# Patient Record
Sex: Female | Born: 1990 | Race: White | Hispanic: No | Marital: Single | Smoking: Current every day smoker
Health system: Southern US, Community
[De-identification: ages and names within clinical notes are randomized; demographics above are authoritative.]

## PROBLEM LIST (undated history)

## (undated) DIAGNOSIS — F329 Major depressive disorder, single episode, unspecified: Secondary | ICD-10-CM

## (undated) DIAGNOSIS — F32A Depression, unspecified: Secondary | ICD-10-CM

## (undated) DIAGNOSIS — O139 Gestational [pregnancy-induced] hypertension without significant proteinuria, unspecified trimester: Secondary | ICD-10-CM

## (undated) DIAGNOSIS — R87629 Unspecified abnormal cytological findings in specimens from vagina: Secondary | ICD-10-CM

## (undated) HISTORY — DX: Unspecified abnormal cytological findings in specimens from vagina: R87.629

## (undated) HISTORY — PX: WISDOM TOOTH EXTRACTION: SHX21

## (undated) HISTORY — PX: LEEP: SHX91

---
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
## 2010-12-23 LAB — COMPREHENSIVE METABOLIC PANEL
ALT: 19 U/L (ref 0–35)
AST: 19 U/L (ref 0–37)
Albumin: 3.7 g/dL (ref 3.5–5.2)
Alkaline Phosphatase: 53 U/L (ref 47–119)
BUN: 10 mg/dL (ref 6–23)
CO2: 24 mEq/L (ref 19–32)
Calcium: 9.2 mg/dL (ref 8.4–10.5)
Chloride: 110 mEq/L (ref 96–112)
Creatinine, Ser: 0.82 mg/dL (ref 0.4–1.2)
Glucose, Bld: 74 mg/dL (ref 70–99)
Potassium: 4.8 mEq/L (ref 3.5–5.1)
Sodium: 137 mEq/L (ref 135–145)
Total Bilirubin: 0.3 mg/dL (ref 0.3–1.2)
Total Protein: 6.9 g/dL (ref 6.0–8.3)

## 2010-12-23 LAB — CBC
HCT: 38.7 % (ref 36.0–49.0)
Hemoglobin: 13.4 g/dL (ref 12.0–16.0)
MCHC: 34.5 g/dL (ref 31.0–37.0)
MCV: 89.8 fL (ref 78.0–98.0)
Platelets: 198 10*3/uL (ref 150–400)
RBC: 4.31 MIL/uL (ref 3.80–5.70)
RDW: 12.3 % (ref 11.4–15.5)
WBC: 8.8 10*3/uL (ref 4.5–13.5)

## 2010-12-23 LAB — URINALYSIS, ROUTINE W REFLEX MICROSCOPIC
Bilirubin Urine: NEGATIVE
Hgb urine dipstick: NEGATIVE
Ketones, ur: NEGATIVE mg/dL
Nitrite: NEGATIVE
Protein, ur: NEGATIVE mg/dL
Urobilinogen, UA: 0.2 mg/dL (ref 0.0–1.0)

## 2010-12-23 LAB — DIFFERENTIAL
Basophils Absolute: 0.1 10*3/uL (ref 0.0–0.1)
Basophils Relative: 1 % (ref 0–1)
Eosinophils Absolute: 0.2 10*3/uL (ref 0.0–1.2)
Eosinophils Relative: 2 % (ref 0–5)
Lymphocytes Relative: 30 % (ref 24–48)

## 2010-12-23 LAB — D-DIMER, QUANTITATIVE: D-Dimer, Quant: 0.22 ug/mL-FEU (ref 0.00–0.48)

## 2011-05-05 ENCOUNTER — Emergency Department (HOSPITAL_COMMUNITY): Payer: Self-pay

## 2011-05-05 ENCOUNTER — Encounter (HOSPITAL_COMMUNITY): Payer: Self-pay

## 2011-05-05 ENCOUNTER — Emergency Department (HOSPITAL_COMMUNITY)
Admission: EM | Admit: 2011-05-05 | Discharge: 2011-05-05 | Disposition: A | Discharge status: Home / Self Care | Payer: Self-pay | Attending: Emergency Medicine | Admitting: Emergency Medicine

## 2011-05-05 DIAGNOSIS — R10819 Abdominal tenderness, unspecified site: Secondary | ICD-10-CM | POA: Insufficient documentation

## 2011-05-05 DIAGNOSIS — M549 Dorsalgia, unspecified: Secondary | ICD-10-CM | POA: Insufficient documentation

## 2011-05-05 DIAGNOSIS — F17200 Nicotine dependence, unspecified, uncomplicated: Secondary | ICD-10-CM | POA: Insufficient documentation

## 2011-05-05 DIAGNOSIS — Z8541 Personal history of malignant neoplasm of cervix uteri: Secondary | ICD-10-CM | POA: Insufficient documentation

## 2011-05-05 DIAGNOSIS — K5900 Constipation, unspecified: Secondary | ICD-10-CM | POA: Insufficient documentation

## 2011-05-05 DIAGNOSIS — R109 Unspecified abdominal pain: Secondary | ICD-10-CM | POA: Insufficient documentation

## 2011-05-05 LAB — CBC
MCH: 31.1 pg (ref 26.0–34.0)
Platelets: 158 10*3/uL (ref 150–400)
RBC: 4.44 MIL/uL (ref 3.87–5.11)
RDW: 13.4 % (ref 11.5–15.5)
WBC: 6.1 10*3/uL (ref 4.0–10.5)

## 2011-05-05 LAB — URINALYSIS, ROUTINE W REFLEX MICROSCOPIC
Bilirubin Urine: NEGATIVE
Glucose, UA: NEGATIVE mg/dL
Hgb urine dipstick: NEGATIVE
Ketones, ur: NEGATIVE mg/dL
Protein, ur: NEGATIVE mg/dL

## 2011-05-05 LAB — COMPREHENSIVE METABOLIC PANEL
Alkaline Phosphatase: 54 U/L (ref 39–117)
BUN: 8 mg/dL (ref 6–23)
CO2: 26 mEq/L (ref 19–32)
Chloride: 103 mEq/L (ref 96–112)
Creatinine, Ser: 0.65 mg/dL (ref 0.50–1.10)
GFR calc non Af Amer: >90 mL/min (ref >90)
Potassium: 3.9 mEq/L (ref 3.5–5.1)
Total Bilirubin: 0.3 mg/dL (ref 0.3–1.2)

## 2011-05-05 MED ORDER — ONDANSETRON HCL 4 MG/2ML IJ SOLN
4.0000 mg | Freq: Once | INTRAMUSCULAR | Status: AC
  Administered 2011-05-05: 4 mg via INTRAVENOUS
  Filled 2011-05-05: qty 2

## 2011-05-05 MED ORDER — HYDROCODONE-ACETAMINOPHEN 5-325 MG PO TABS: 1.0000 | ORAL_TABLET | Freq: Four times a day (QID) | ORAL | Status: AC | PRN

## 2011-05-05 MED ORDER — SODIUM CHLORIDE 0.9 % IV BOLUS (SEPSIS)
250.0000 mL | Freq: Once | INTRAVENOUS | Status: AC
  Administered 2011-05-05: 250 mL via INTRAVENOUS

## 2011-05-05 MED ORDER — IOHEXOL 300 MG/ML  SOLN
100.0000 mL | Freq: Once | INTRAMUSCULAR | Status: AC | PRN
  Administered 2011-05-05: 100 mL via INTRAVENOUS

## 2011-05-05 MED ORDER — IOHEXOL 300 MG/ML  SOLN: 40.0000 mL | Freq: Once | INTRAMUSCULAR | Status: DC | PRN

## 2011-05-05 MED ORDER — IOHEXOL 300 MG/ML  SOLN
40.0000 mL | Freq: Once | INTRAMUSCULAR | Status: AC | PRN
  Administered 2011-05-05: 40 mL via ORAL

## 2011-05-05 MED ORDER — HYDROMORPHONE HCL PF 1 MG/ML IJ SOLN
1.0000 mg | Freq: Once | INTRAMUSCULAR | Status: AC
  Administered 2011-05-05: 1 mg via INTRAVENOUS
  Filled 2011-05-05: qty 1

## 2011-05-05 MED ORDER — SODIUM CHLORIDE 0.9 % IV SOLN: INTRAVENOUS | Status: DC

## 2011-05-05 NOTE — ED Notes (Signed)
Pt presents with bilateral flank pain and was diagnosed with UTI on Wednesday. Pt also reports "cervical" pain. Pt states she was diagnosed with cervical CA.

## 2011-05-05 NOTE — ED Provider Notes (Signed)
History   This chart was scribed for Lori Jakes, MD by Melba Coon. The patient was seen in room APA03/APA03 and the patient's care was started at 3:15PM.    CSN: [id number]  Arrival date & time 05/05/11  1441   First MD Initiated Contact with Patient 05/05/11 1510      Chief Complaint  Patient presents with  . Urinary Tract Infection  . Back Pain    (Consider location/radiation/quality/duration/timing/severity/associated sxs/prior treatment) HPI Lori Barnes is a 21 y.o. female who presents to the Emergency Department complaining of constant, moderate to severe bilateral back pain with associated stabbing cervical pain with an onset a week ago. Pt has been seen by a Dr for Lori Barnes symptoms before and was diagnosed as a UTI 2 days ago. She was given abx (taken every 12 hrs) which have not alleviated the symptoms and the pain has been gradually getting worse since onset. Her previous Dr told her to present to the ED. Pt has a Hx of cervical CA that was removed 2 months ago. Pt states that she does not have dysuria but it "feels weird to pee" and that she feels like she has to force herself to urinate. Fever present in the morning; pt states that the fever breaks around lunchtime, then comes back at night. Constipation present. Migraine HA present but doesn't think its related to CC. No neck pain, n/v/d, rash, or edema. LNMP: 5th of January; has not had one this month.  GYN: Dr. Donzetta Barnes in [place]  Yellow and black pill starts with N??   Past Medical History  Diagnosis Date  . Cancer     cervical/ had leep procedure    History reviewed. No pertinent past surgical history.  No family history on file.  History  Substance Use Topics  . Smoking status: Current Everyday Smoker -- 0.5 packs/day  . Smokeless tobacco: Not on file  . Alcohol Use: Yes     occasional     OB History    Grav Para Term Preterm Abortions TAB SAB Ect Mult Living                  Review of  Systems 10 Systems reviewed and are negative for acute change except as noted in the HPI.  Allergies  Review of patient's allergies indicates no known allergies.  Home Medications   Current Outpatient Rx  Name Route Sig Dispense Refill  . ACETAMINOPHEN 500 MG PO TABS Oral Take 1,000 mg by mouth 3 (three) times daily as needed. For pain    . NITROFURANTOIN MACROCRYSTAL 100 MG PO CAPS Oral Take 100 mg by mouth every 12 (twelve) hours.    Marland Kitchen HYDROCODONE-ACETAMINOPHEN 5-325 MG PO TABS Oral Take 1-2 tablets by mouth every 6 (six) hours as needed for pain. 10 tablet 0    BP 108/56  Pulse 89  Temp(Src) 98 F (36.7 C) (Oral)  Resp 18  Ht 5' (1.524 m)  Wt 170 lb (77.111 kg)  BMI 33.20 kg/m2  SpO2 99%  LMP 03/25/2011  Physical Exam  Nursing note and vitals reviewed. Constitutional: She appears well-developed and well-nourished.       Awake, alert, nontoxic appearance.  HENT:  Head: Normocephalic and atraumatic.  Mouth/Throat: Oropharynx is clear and moist.  Eyes: Conjunctivae and EOM are normal. Pupils are equal, round, and reactive to light. Right eye exhibits no discharge. Left eye exhibits no discharge.  Neck: Normal range of motion. Neck supple.  Cardiovascular: Normal rate, regular  rhythm and normal heart sounds.   No murmur heard. Pulmonary/Chest: Effort normal and breath sounds normal. She has no wheezes. She exhibits no tenderness.  Abdominal: Soft. Bowel sounds are normal. There is tenderness (Mild suprapubic tenderness). There is no rebound.  Musculoskeletal: She exhibits no tenderness (No CVA tenderness).       Baseline ROM, no obvious new focal weakness.  Neurological:       Mental status and motor strength appears baseline for patient and situation.  Skin: Skin is warm. No rash noted.  Psychiatric: She has a normal mood and affect.    ED Course  Procedures (including critical care time)  DIAGNOSTIC STUDIES: Oxygen Saturation is 97% on room air, normal by my  interpretation.    COORDINATION OF CARE:  Results for orders placed during the hospital encounter of 05/05/11  URINALYSIS, ROUTINE W REFLEX MICROSCOPIC      Component Value Range   Color, Urine YELLOW  YELLOW    APPearance CLEAR  CLEAR    Specific Gravity, Urine 1.010  1.005 - 1.030    pH 6.5  5.0 - 8.0    Glucose, UA NEGATIVE  NEGATIVE (mg/dL)   Hgb urine dipstick NEGATIVE  NEGATIVE    Bilirubin Urine NEGATIVE  NEGATIVE    Ketones, ur NEGATIVE  NEGATIVE (mg/dL)   Protein, ur NEGATIVE  NEGATIVE (mg/dL)   Urobilinogen, UA 0.2  0.0 - 1.0 (mg/dL)   Nitrite NEGATIVE  NEGATIVE    Leukocytes, UA NEGATIVE  NEGATIVE   PREGNANCY, URINE      Component Value Range   Preg Test, Ur NEGATIVE  NEGATIVE   COMPREHENSIVE METABOLIC PANEL      Component Value Range   Sodium 136  135 - 145 (mEq/L)   Potassium 3.9  3.5 - 5.1 (mEq/L)   Chloride 103  96 - 112 (mEq/L)   CO2 26  19 - 32 (mEq/L)   Glucose, Bld 88  70 - 99 (mg/dL)   BUN 8  6 - 23 (mg/dL)   Creatinine, Ser 1.61  0.50 - 1.10 (mg/dL)   Calcium 9.4  8.4 - 09.6 (mg/dL)   Total Protein 6.8  6.0 - 8.3 (g/dL)   Albumin 3.8  3.5 - 5.2 (g/dL)   AST 16  0 - 37 (U/L)   ALT 13  0 - 35 (U/L)   Alkaline Phosphatase 54  39 - 117 (U/L)   Total Bilirubin 0.3  0.3 - 1.2 (mg/dL)   GFR calc non Af Amer >90  >90 (mL/min)   GFR calc Af Amer >90  >90 (mL/min)  CBC      Component Value Range   WBC 6.1  4.0 - 10.5 (K/uL)   RBC 4.44  3.87 - 5.11 (MIL/uL)   Hemoglobin 13.8  12.0 - 15.0 (g/dL)   HCT 04.5  40.9 - 81.1 (%)   MCV 94.4  78.0 - 100.0 (fL)   MCH 31.1  26.0 - 34.0 (pg)   MCHC 32.9  30.0 - 36.0 (g/dL)   RDW 91.4  78.2 - 95.6 (%)   Platelets 158  150 - 400 (K/uL)    Ct Abdomen Pelvis W Contrast  05/05/2011  *RADIOLOGY REPORT*  Clinical Data: Bilateral flank pain.  Being treated for urinary tract infection for 2 days.  Suprapubic pain with nausea vomiting. History of cervical cancer.  CT ABDOMEN AND PELVIS WITH CONTRAST  Technique:   Multidetector CT imaging of the abdomen and pelvis was performed following the standard protocol during bolus administration  of intravenous contrast.  Contrast: OMNIPAQUE IOHEXOL 300 MG/ML IV SOLN  Comparison: None.  Findings: Minimal ground-glass opacity at the left lung base with mild motion degradation in this area.  Heart size upper normal, without pericardial or pleural effusion.  Tiny hiatal hernia.  Normal liver, spleen, stomach, pancreas, gallbladder, biliary tract, adrenal glands, kidneys. No hydroureter or ureteric calculi. Small retroperitoneal nodes, without adenopathy.  Normal colon, appendix, and terminal ileum.  Normal small bowel without abdominal ascites.  No pelvic adenopathy.    Normal urinary bladder and uterus.  No adnexal mass.  No significant free fluid.  Disc bulges at L4-L5 and L5-S1 incidentally noted.  IMPRESSION:  1. No acute process in the abdomen or pelvis. 2.  Probable atelectasis at the left lung base.  Original Report Authenticated By: Consuello Bossier, M.D.     1. Abdominal pain       MDM  Workup today negative for any significant pathology. Brain CT chest negative urinalysis negative for urinary tract infection CT scan without specific findings to explain bilateral upper quadrant and lower quadrant abdominal pain. Recommend followup with GYN Dr. In Jonita Albee for further evaluation.  I personally performed the services described in this documentation, which was scribed in my presence. The recorded information has been reviewed and considered.         Lori Jakes, MD 05/05/11 [phone]

## 2012-12-13 ENCOUNTER — Emergency Department (HOSPITAL_COMMUNITY)
Admission: EM | Admit: 2012-12-13 | Discharge: 2012-12-13 | Disposition: A | Discharge status: Home / Self Care | Payer: Self-pay | Attending: Emergency Medicine | Admitting: Emergency Medicine

## 2012-12-13 ENCOUNTER — Encounter (HOSPITAL_COMMUNITY): Payer: Self-pay | Admitting: *Deleted

## 2012-12-13 DIAGNOSIS — N946 Dysmenorrhea, unspecified: Secondary | ICD-10-CM

## 2012-12-13 DIAGNOSIS — R102 Pelvic and perineal pain: Secondary | ICD-10-CM

## 2012-12-13 DIAGNOSIS — F17200 Nicotine dependence, unspecified, uncomplicated: Secondary | ICD-10-CM | POA: Insufficient documentation

## 2012-12-13 DIAGNOSIS — Z3202 Encounter for pregnancy test, result negative: Secondary | ICD-10-CM | POA: Insufficient documentation

## 2012-12-13 DIAGNOSIS — Z8541 Personal history of malignant neoplasm of cervix uteri: Secondary | ICD-10-CM | POA: Insufficient documentation

## 2012-12-13 DIAGNOSIS — N949 Unspecified condition associated with female genital organs and menstrual cycle: Secondary | ICD-10-CM | POA: Insufficient documentation

## 2012-12-13 LAB — WET PREP, GENITAL: Trich, Wet Prep: NONE SEEN

## 2012-12-13 LAB — URINALYSIS, ROUTINE W REFLEX MICROSCOPIC
Bilirubin Urine: NEGATIVE
Ketones, ur: NEGATIVE mg/dL
Leukocytes, UA: NEGATIVE
Nitrite: NEGATIVE
Urobilinogen, UA: 0.2 mg/dL (ref 0.0–1.0)
pH: 6.5 (ref 5.0–8.0)

## 2012-12-13 LAB — URINE MICROSCOPIC-ADD ON

## 2012-12-13 MED ORDER — IBUPROFEN 800 MG PO TABS: 800.0000 mg | ORAL_TABLET | Freq: Three times a day (TID) | ORAL | Status: DC

## 2012-12-13 NOTE — ED Provider Notes (Signed)
CSN: [id number]     Arrival date & time 12/13/12  2059 History  This chart was scribed for Glynn Octave, MD by Bennett Scrape, ED Scribe. This patient was seen in room APA06/APA06 and the patient's care was started at 9:22 PM.   Chief Complaint  Patient presents with  . Back Pain  . Abdominal Pain    The history is provided by the patient. No language interpreter was used.   HPI Comments: Lori Barnes is a 22 y.o. female who presents to the Emergency Department complaining of sharp cramps in the lower back that radiates up to the mid back with associated mild lower abdominal pain and vaginal bleeding that started yesterday morning. Her LNMP was August 22nd, 2104 so when the symptoms originally started she attributed them to her normal menses. She denies that the symptoms are similar to her prior menstrual cramps but states that she thought that she was going to have a heavier menses than normal. She became concerned when she passed "something like a dark mucus clot" that size of a quarter from her vagina. Since then, she states that she only has vaginal bleeding during episodes of crying or stress. She reports taking Advil with no improvement in the pain. She states that laying still improves the symptoms and she denies having trouble sleeping due to the pain. She called [facility] and was advised to come to the ED for evaluation. Pt denies having prior episodes of similar symptoms. She denies any known fevers, hematuria, dysuria and emesis as associated symptoms. She denies any changes in her bowel movements and reports that she has been eating and drinking normally since the onset.  She denies being on any birth control currently. She denies any prior abdominal surgeries.   Past Medical History  Diagnosis Date  . Cancer     cervical/ had leep procedure   History reviewed. No pertinent past surgical history. History reviewed. No pertinent family history. History  Substance  Use Topics  . Smoking status: Current Every Day Smoker -- 0.50 packs/day  . Smokeless tobacco: Not on file  . Alcohol Use: Yes     Comment: occasional    No OB history provided.  Review of Systems  A complete 10 system review of systems was obtained and all systems are negative except as noted in the HPI and PMH.   Allergies  Review of patient's allergies indicates no known allergies.  Home Medications   Current Outpatient Rx  Name  Route  Sig  Dispense  Refill  . ibuprofen (ADVIL,MOTRIN) 200 MG tablet   Oral   Take 400-600 mg by mouth every 6 (six) hours as needed for pain.         Marland Kitchen UNKNOWN TO PATIENT   Oral   Take 1 tablet by mouth daily. BIRTH CONTROL         . ibuprofen (ADVIL,MOTRIN) 800 MG tablet   Oral   Take 1 tablet (800 mg total) by mouth 3 (three) times daily.   21 tablet   0    Triage Vitals: BP 120/59  Pulse 106  Temp(Src) 97.7 F (36.5 C)  Resp 20  Ht 5\' 1"  (1.549 m)  Wt 168 lb (76.204 kg)  BMI 31.76 kg/m2  SpO2 100%  LMP 11/08/2012  Physical Exam  Nursing note and vitals reviewed. Constitutional: She is oriented to person, place, and time. She appears well-developed and well-nourished. No distress.  HENT:  Head: Normocephalic and atraumatic.  Eyes: Conjunctivae  and EOM are normal.  Neck: Normal range of motion. Neck supple. No tracheal deviation present.  Cardiovascular: Normal rate, regular rhythm and normal heart sounds.   No murmur heard. Pulmonary/Chest: Effort normal and breath sounds normal. No respiratory distress. She has no wheezes. She has no rales.  Abdominal: Soft. Bowel sounds are normal. There is tenderness (suprapubic ). There is no rebound, no guarding and no tenderness at McBurney's point.  No CVA tenderness  Genitourinary:  Normal external female genitalia, dark blood in the vaginal vault with oozing from cervix, no CMT, no adnexal tenderness, chaperone present  Musculoskeletal: Normal range of motion. She exhibits no  edema.  Bilateral paraspinal lumbar tenderness  Neurological: She is alert and oriented to person, place, and time. No cranial nerve deficit.  5/5 strength in bilateral lower extremities. Ankle plantar and dorsiflexion intact. Great toe extension intact bilaterally. +2 DP and PT pulses. +2 patellar reflexes bilaterally.   Skin: Skin is warm and dry.  Psychiatric: She has a normal mood and affect. Her behavior is normal.    ED Course  Procedures (including critical care time)  DIAGNOSTIC STUDIES: Oxygen Saturation is 100% on room air, normal by my interpretation.    COORDINATION OF CARE: 9:24 PM-Discussed treatment plan which includes pelvic exam and UA with pt at bedside and pt agreed to plan.   Labs Review Labs Reviewed  URINALYSIS, ROUTINE W REFLEX MICROSCOPIC - Abnormal; Notable for the following:    Hgb urine dipstick MODERATE (*)    All other components within normal limits  WET PREP, GENITAL  GC/CHLAMYDIA PROBE AMP  PREGNANCY, URINE  URINE MICROSCOPIC-ADD ON   Imaging Review No results found.  MDM   1. Menses painful   2. Pelvic pain    Lower abdominal pain with back pain that onset yesterday morning associated with intermittent vaginal bleeding.  Abdominal exam is benign. Pelvic exam shows dark blood in the vaginal vault without adnexal tenderness.  UA is negative for infection. HCG negative. Suspect patient's discomfort and vaginal bleeding secondary to normal menses. We'll treat with anti-inflammatories.  I personally performed the services described in this documentation, which was scribed in my presence. The recorded information has been reviewed and is accurate.  BP 120/59  Pulse 106  Temp(Src) 97.7 F (36.5 C)  Resp 20  Ht 5\' 1"  (1.549 m)  Wt 168 lb (76.204 kg)  BMI 31.76 kg/m2  SpO2 100%  LMP 11/08/2012   Glynn Octave, MD 12/13/12 2308

## 2012-12-13 NOTE — ED Notes (Signed)
Pt states 36 hrs ago, she began having sharp pains in her mid left back. Pt also states she was having some cramps in her luq. Pt states she began having what she thought was her period an then passed a quarter sized dark mucous "clot" and then the bleeding stopped.

## 2012-12-15 LAB — GC/CHLAMYDIA PROBE AMP
CT Probe RNA: NEGATIVE
GC Probe RNA: NEGATIVE

## 2013-12-15 ENCOUNTER — Encounter (HOSPITAL_COMMUNITY): Payer: Self-pay | Admitting: Emergency Medicine

## 2013-12-15 ENCOUNTER — Emergency Department (HOSPITAL_COMMUNITY)
Admission: EM | Admit: 2013-12-15 | Discharge: 2013-12-15 | Discharge status: Against Medical Advice | Payer: Self-pay | Attending: Emergency Medicine | Admitting: Emergency Medicine

## 2013-12-15 DIAGNOSIS — Y939 Activity, unspecified: Secondary | ICD-10-CM | POA: Insufficient documentation

## 2013-12-15 DIAGNOSIS — R1110 Vomiting, unspecified: Secondary | ICD-10-CM | POA: Insufficient documentation

## 2013-12-15 DIAGNOSIS — Y929 Unspecified place or not applicable: Secondary | ICD-10-CM | POA: Insufficient documentation

## 2013-12-15 DIAGNOSIS — F17200 Nicotine dependence, unspecified, uncomplicated: Secondary | ICD-10-CM | POA: Insufficient documentation

## 2013-12-15 DIAGNOSIS — IMO0002 Reserved for concepts with insufficient information to code with codable children: Secondary | ICD-10-CM | POA: Insufficient documentation

## 2013-12-15 DIAGNOSIS — R296 Repeated falls: Secondary | ICD-10-CM | POA: Insufficient documentation

## 2013-12-15 DIAGNOSIS — R42 Dizziness and giddiness: Secondary | ICD-10-CM | POA: Insufficient documentation

## 2013-12-15 NOTE — ED Notes (Signed)
Pt c/o lower back pain after falling this am; pt states she began vomiting x 3 hours ago and is c/o feeling dizzy

## 2013-12-15 NOTE — ED Notes (Signed)
Called x 1 to go to room after triage with no answer.

## 2013-12-15 NOTE — ED Notes (Signed)
Unable to locate pt x 3 in all waiting areas.

## 2013-12-15 NOTE — ED Notes (Signed)
Called pt in all waiting areas with no answer

## 2013-12-16 ENCOUNTER — Emergency Department (HOSPITAL_COMMUNITY): Payer: Self-pay

## 2013-12-16 ENCOUNTER — Emergency Department (HOSPITAL_COMMUNITY)
Admission: EM | Admit: 2013-12-16 | Discharge: 2013-12-16 | Disposition: A | Discharge status: Home / Self Care | Payer: Self-pay | Attending: Emergency Medicine | Admitting: Emergency Medicine

## 2013-12-16 ENCOUNTER — Encounter (HOSPITAL_COMMUNITY): Payer: Self-pay | Admitting: Emergency Medicine

## 2013-12-16 DIAGNOSIS — R112 Nausea with vomiting, unspecified: Secondary | ICD-10-CM

## 2013-12-16 DIAGNOSIS — Z791 Long term (current) use of non-steroidal anti-inflammatories (NSAID): Secondary | ICD-10-CM | POA: Insufficient documentation

## 2013-12-16 DIAGNOSIS — F17200 Nicotine dependence, unspecified, uncomplicated: Secondary | ICD-10-CM | POA: Insufficient documentation

## 2013-12-16 DIAGNOSIS — R109 Unspecified abdominal pain: Secondary | ICD-10-CM | POA: Insufficient documentation

## 2013-12-16 DIAGNOSIS — Z3202 Encounter for pregnancy test, result negative: Secondary | ICD-10-CM | POA: Insufficient documentation

## 2013-12-16 DIAGNOSIS — R10A1 Flank pain, right side: Secondary | ICD-10-CM

## 2013-12-16 LAB — URINALYSIS, ROUTINE W REFLEX MICROSCOPIC
Glucose, UA: NEGATIVE mg/dL
Nitrite: NEGATIVE
Specific Gravity, Urine: 1.02 (ref 1.005–1.030)
Urobilinogen, UA: 1 mg/dL (ref 0.0–1.0)
pH: 7 (ref 5.0–8.0)

## 2013-12-16 LAB — URINE MICROSCOPIC-ADD ON

## 2013-12-16 LAB — BASIC METABOLIC PANEL
Anion gap: 10 (ref 5–15)
BUN: 11 mg/dL (ref 6–23)
CO2: 25 mEq/L (ref 19–32)
Calcium: 8.9 mg/dL (ref 8.4–10.5)
Chloride: 103 mEq/L (ref 96–112)
Creatinine, Ser: 0.79 mg/dL (ref 0.50–1.10)
GFR calc Af Amer: >90 mL/min (ref >90)
GLUCOSE: 91 mg/dL (ref 70–99)
Potassium: 3.7 mEq/L (ref 3.7–5.3)
Sodium: 138 mEq/L (ref 137–147)

## 2013-12-16 LAB — CBC WITH DIFFERENTIAL/PLATELET
Basophils Absolute: 0 10*3/uL (ref 0.0–0.1)
Basophils Relative: 0 % (ref 0–1)
EOS PCT: 2 % (ref 0–5)
Eosinophils Absolute: 0.2 10*3/uL (ref 0.0–0.7)
HCT: 39.4 % (ref 36.0–46.0)
Hemoglobin: 13.2 g/dL (ref 12.0–15.0)
LYMPHS ABS: 2.3 10*3/uL (ref 0.7–4.0)
Lymphocytes Relative: 33 % (ref 12–46)
MCH: 30.6 pg (ref 26.0–34.0)
MCHC: 33.5 g/dL (ref 30.0–36.0)
MCV: 91.4 fL (ref 78.0–100.0)
Monocytes Absolute: 0.5 10*3/uL (ref 0.1–1.0)
Monocytes Relative: 7 % (ref 3–12)
Neutro Abs: 4.1 10*3/uL (ref 1.7–7.7)
Neutrophils Relative %: 58 % (ref 43–77)
PLATELETS: 213 10*3/uL (ref 150–400)
RBC: 4.31 MIL/uL (ref 3.87–5.11)
RDW: 12.7 % (ref 11.5–15.5)
WBC: 7.1 10*3/uL (ref 4.0–10.5)

## 2013-12-16 LAB — HEPATIC FUNCTION PANEL
ALT: 18 U/L (ref 0–35)
AST: 21 U/L (ref 0–37)
Albumin: 3.8 g/dL (ref 3.5–5.2)
Alkaline Phosphatase: 64 U/L (ref 39–117)
Total Bilirubin: 0.2 mg/dL — ABNORMAL LOW (ref 0.3–1.2)
Total Protein: 7.2 g/dL (ref 6.0–8.3)

## 2013-12-16 LAB — PREGNANCY, URINE: Preg Test, Ur: NEGATIVE

## 2013-12-16 LAB — LIPASE, BLOOD: Lipase: 20 U/L (ref 11–59)

## 2013-12-16 MED ORDER — HYDROCODONE-ACETAMINOPHEN 5-325 MG PO TABS: ORAL_TABLET | ORAL | Status: DC

## 2013-12-16 MED ORDER — KETOROLAC TROMETHAMINE 60 MG/2ML IM SOLN
60.0000 mg | Freq: Once | INTRAMUSCULAR | Status: AC
  Administered 2013-12-16: 60 mg via INTRAMUSCULAR
  Filled 2013-12-16: qty 2

## 2013-12-16 MED ORDER — OXYCODONE-ACETAMINOPHEN 5-325 MG PO TABS
1.0000 | ORAL_TABLET | Freq: Once | ORAL | Status: AC
  Administered 2013-12-16: 1 via ORAL
  Filled 2013-12-16: qty 1

## 2013-12-16 MED ORDER — ONDANSETRON HCL 4 MG PO TABS: 4.0000 mg | ORAL_TABLET | Freq: Three times a day (TID) | ORAL | Status: DC | PRN

## 2013-12-16 MED ORDER — NAPROXEN 250 MG PO TABS: 250.0000 mg | ORAL_TABLET | Freq: Two times a day (BID) | ORAL | Status: DC | PRN

## 2013-12-16 MED ORDER — ONDANSETRON 8 MG PO TBDP
8.0000 mg | ORAL_TABLET | Freq: Once | ORAL | Status: AC
  Administered 2013-12-16: 8 mg via ORAL
  Filled 2013-12-16: qty 1

## 2013-12-16 NOTE — ED Provider Notes (Signed)
CSN: [id number]     Arrival date & time 12/16/13  1510 History   First MD Initiated Contact with Patient 12/16/13 1624     Chief Complaint  Patient presents with  . Abdominal Pain      HPI Pt was seen at 1740.  Per pt, c/o gradual onset and persistence of constant right sided upper abd/flank "pain" since yesterday.  Has been associated with multiple intermittent episodes of N/V.  Describes the abd pain as "aching."  States her symptoms began after she slipped and fell, landing on her right side. Denies diarrhea, no fevers, no back pain, no rash, no CP/SOB, no black or blood in stools or emesis, no dysuria/hematuria. Pt came to the ED for evaluation yesterday, but LWBS.    History reviewed. No pertinent past medical history.  Past Surgical History  Procedure Laterality Date  . Leep      History  Substance Use Topics  . Smoking status: Current Every Day Smoker -- 0.50 packs/day    Types: Cigarettes  . Smokeless tobacco: Not on file  . Alcohol Use: No    Review of Systems ROS: Statement: All systems negative except as marked or noted in the HPI; Constitutional: Negative for fever and chills. ; ; Eyes: Negative for eye pain, redness and discharge. ; ; ENMT: Negative for ear pain, hoarseness, nasal congestion, sinus pressure and sore throat. ; ; Cardiovascular: Negative for chest pain, palpitations, diaphoresis, dyspnea and peripheral edema. ; ; Respiratory: Negative for cough, wheezing and stridor. ; ; Gastrointestinal: +N/V, abd pain. Negative for diarrhea, blood in stool, hematemesis, jaundice and rectal bleeding. . ; ; Genitourinary: +flank pain. Negative for dysuria and hematuria. ; ; Musculoskeletal: Negative for back pain and neck pain. Negative for swelling and trauma.; ; Skin: Negative for pruritus, rash, abrasions, blisters, bruising and skin lesion.; ; Neuro: Negative for headache, lightheadedness and neck stiffness. Negative for weakness, altered level of consciousness , altered  mental status, extremity weakness, paresthesias, involuntary movement, seizure and syncope.      Allergies  Review of patient's allergies indicates no known allergies.  Home Medications   Prior to Admission medications   Medication Sig Start Date End Date Taking? Authorizing Provider  ibuprofen (ADVIL,MOTRIN) 200 MG tablet Take 400-600 mg by mouth every 6 (six) hours as needed for pain.    Historical Provider, MD  UNKNOWN TO PATIENT Take 1 tablet by mouth daily. BIRTH CONTROL    Historical Provider, MD   BP 136/89  Pulse 77  Temp(Src) 98.9 F (37.2 C) (Oral)  Resp 16  Ht 5\' 1"  (1.549 m)  Wt 178 lb (80.74 kg)  BMI 33.65 kg/m2  SpO2 100%  LMP 12/12/2013 Physical Exam 1745: Physical examination:  Nursing notes reviewed; Vital signs and O2 SAT reviewed;  Constitutional: Well developed, Well nourished, Well hydrated, In no acute distress; Head:  Normocephalic, atraumatic; Eyes: EOMI, PERRL, No scleral icterus; ENMT: Mouth and pharynx normal, Mucous membranes moist; Neck: Supple, Full range of motion, No lymphadenopathy; Cardiovascular: Regular rate and rhythm, No murmur, rub, or gallop; Respiratory: Breath sounds clear & equal bilaterally, No rales, rhonchi, wheezes.  Speaking full sentences with ease, Normal respiratory effort/excursion; Chest: Nontender, Movement normal. No deformity, no soft tissue crepitus, no ecchymosis or abrasions.; Abdomen: Soft, Nontender, Nondistended, Normal bowel sounds. No abrasions or ecchymosis.; Genitourinary: No CVA tenderness; Spine:  No midline CS, TS, LS tenderness. +TTP right lateral torso and right lumbar paraspinal muscles. No ecchymosis, no abrasions.;; Extremities: Pulses normal, No tenderness, No  edema, No calf edema or asymmetry.; Neuro: AA&Ox3, Major CN grossly intact.  Speech clear. No gross focal motor or sensory deficits in extremities. Climbs on and off stretcher easily by herself. Gait steady..; Skin: Color normal, Warm, Dry.   ED Course   Procedures     EKG Interpretation None      MDM  MDM Reviewed: previous chart, nursing note and vitals Reviewed previous: labs Interpretation: labs, ultrasound and x-ray   Results for orders placed during the hospital encounter of 12/16/13  CBC WITH DIFFERENTIAL      Result Value Ref Range   WBC 7.1  4.0 - 10.5 K/uL   RBC 4.31  3.87 - 5.11 MIL/uL   Hemoglobin 13.2  12.0 - 15.0 g/dL   HCT 16.1  09.6 - 04.5 %   MCV 91.4  78.0 - 100.0 fL   MCH 30.6  26.0 - 34.0 pg   MCHC 33.5  30.0 - 36.0 g/dL   RDW 40.9  81.1 - 91.4 %   Platelets 213  150 - 400 K/uL   Neutrophils Relative % 58  43 - 77 %   Neutro Abs 4.1  1.7 - 7.7 K/uL   Lymphocytes Relative 33  12 - 46 %   Lymphs Abs 2.3  0.7 - 4.0 K/uL   Monocytes Relative 7  3 - 12 %   Monocytes Absolute 0.5  0.1 - 1.0 K/uL   Eosinophils Relative 2  0 - 5 %   Eosinophils Absolute 0.2  0.0 - 0.7 K/uL   Basophils Relative 0  0 - 1 %   Basophils Absolute 0.0  0.0 - 0.1 K/uL  BASIC METABOLIC PANEL      Result Value Ref Range   Sodium 138  137 - 147 mEq/L   Potassium 3.7  3.7 - 5.3 mEq/L   Chloride 103  96 - 112 mEq/L   CO2 25  19 - 32 mEq/L   Glucose, Bld 91  70 - 99 mg/dL   BUN 11  6 - 23 mg/dL   Creatinine, Ser 7.82  0.50 - 1.10 mg/dL   Calcium 8.9  8.4 - 95.6 mg/dL   GFR calc non Af Amer >90  >90 mL/min   GFR calc Af Amer >90  >90 mL/min   Anion gap 10  5 - 15  URINALYSIS, ROUTINE W REFLEX MICROSCOPIC      Result Value Ref Range   Color, Urine YELLOW  YELLOW   APPearance CLEAR  CLEAR   Specific Gravity, Urine 1.020  1.005 - 1.030   pH 7.0  5.0 - 8.0   Glucose, UA NEGATIVE  NEGATIVE mg/dL   Hgb urine dipstick MODERATE (*) NEGATIVE   Bilirubin Urine SMALL (*) NEGATIVE   Ketones, ur TRACE (*) NEGATIVE mg/dL   Protein, ur TRACE (*) NEGATIVE mg/dL   Urobilinogen, UA 1.0  0.0 - 1.0 mg/dL   Nitrite NEGATIVE  NEGATIVE   Leukocytes, UA SMALL (*) NEGATIVE  PREGNANCY, URINE      Result Value Ref Range   Preg Test, Ur NEGATIVE   NEGATIVE  HEPATIC FUNCTION PANEL      Result Value Ref Range   Total Protein 7.2  6.0 - 8.3 g/dL   Albumin 3.8  3.5 - 5.2 g/dL   AST 21  0 - 37 U/L   ALT 18  0 - 35 U/L   Alkaline Phosphatase 64  39 - 117 U/L   Total Bilirubin 0.2 (*) 0.3 - 1.2 mg/dL  Bilirubin, Direct <0.2  0.0 - 0.3 mg/dL   Indirect Bilirubin NOT CALCULATED  0.3 - 0.9 mg/dL  LIPASE, BLOOD      Result Value Ref Range   Lipase 20  11 - 59 U/L  URINE MICROSCOPIC-ADD ON      Result Value Ref Range   Squamous Epithelial / LPF MANY (*) RARE   WBC, UA 3-6  <3 WBC/hpf   RBC / HPF 7-10  <3 RBC/hpf   Bacteria, UA FEW (*) RARE   Crystals CA OXALATE CRYSTALS (*) NEGATIVE   Dg Chest 2 View 12/16/2013   CLINICAL DATA:  Chest pain.  EXAM: CHEST  2 VIEW  COMPARISON:  None.  FINDINGS: The heart size and mediastinal contours are within normal limits. Both lungs are clear. No pneumothorax or pleural effusion is noted. The visualized skeletal structures are unremarkable.  IMPRESSION: No acute cardiopulmonary abnormality seen.   Electronically Signed   By: Roque LiasJames  Green M.D.   On: 12/16/2013 16:11   Dg Ribs Unilateral Right 12/16/2013   EXAM: RIGHT RIBS - 2 VIEW  COMPARISON:  None.  FINDINGS: No fracture or other bone lesions are seen involving the ribs.  IMPRESSION: Negative.   Electronically Signed   By: Maisie Fushomas  Register   On: 12/16/2013 16:54   [place] Abdomen Complete 12/16/2013   CLINICAL DATA:  Abdominal pain.  EXAM: ULTRASOUND ABDOMEN COMPLETE  COMPARISON:  CT scan of May 05, 2011.  FINDINGS: Gallbladder:  No gallstones or wall thickening visualized. No sonographic Murphy sign noted.  Common bile duct:  Diameter: Measures 2.2 mm which is within normal limits.  Liver:  No focal lesion identified. Within normal limits in parenchymal echogenicity.  IVC:  No abnormality visualized.  Pancreas:  Visualized portion unremarkable.  Spleen:  Size and appearance within normal limits.  Right Kidney:  Length: 9.5 cm. Echogenicity within normal  limits. No mass or hydronephrosis visualized.  Left Kidney:  Length: 9.4 cm. Echogenicity within normal limits. No mass or hydronephrosis visualized.  Abdominal aorta:  No aneurysm visualized.  Other findings:  None.  IMPRESSION: No definite abnormality seen in the abdomen.   Electronically Signed   By: Roque LiasJames  Green M.D.   On: 12/16/2013 17:41     1825:  Workup reassuring: no FF or acute GB on US, no ribs fx on XR, +RBC and Ca oxalate crystals in Udip but US without hydronephrosis. Will tx symptomatically at this time. Pt has tol PO well while in the ED without N/V and VS remain stable. Dx and testing d/w pt and family.  Questions answered.  Verb understanding, agreeable to d/c home with outpt f/u.   Samuel JesterKathleen Suleman Gunning, DO 12/17/13 [id number]

## 2013-12-16 NOTE — ED Notes (Signed)
PT c/o right upper quadrant abdominal pain with n/v and denies diarrhea x2 days. PT states she fell yesterday and c/o right rib pain and came to ED but left prior to being seen.

## 2013-12-16 NOTE — Discharge Instructions (Signed)
°Emergency Department Resource Guide °1) Find a Doctor and Pay Out of Pocket °Although you won't have to find out who is covered by your insurance plan, it is a good idea to ask around and get recommendations. You will then need to call the office and see if the doctor you have chosen will accept you as a new patient and what types of options they offer for patients who are self-pay. Some doctors offer discounts or will set up payment plans for their patients who do not have insurance, but you will need to ask so you aren't surprised when you get to your appointment. ° °2) Contact Your Local Health Department °Not all health departments have doctors that can see patients for sick visits, but many do, so it is worth a call to see if yours does. If you don't know where your local health department is, you can check in your phone book. The CDC also has a tool to help you locate your state's health department, and many state websites also have listings of all of their local health departments. ° °3) Find a Walk-in Clinic °If your illness is not likely to be very severe or complicated, you may want to try a walk in clinic. These are popping up all over the country in pharmacies, drugstores, and shopping centers. They're usually staffed by nurse practitioners or physician assistants that have been trained to treat common illnesses and complaints. They're usually fairly quick and inexpensive. However, if you have serious medical issues or chronic medical problems, these are probably not your best option. ° °No Primary Care Doctor: °- Call [facility] at  832-8000 - they can help you locate a primary care doctor that  accepts your insurance, provides certain services, etc. °- Physician Referral Service- 1-800-533-3463 ° °Chronic Pain Problems: °Organization         Address  Phone   Notes  °[facility]  (336) 297-2271 Patients need to be referred by their primary care doctor.  ° °Medication  Assistance: °Organization         Address  Phone   Notes  °[place] [facility] [address] °[place], [place] [address] (336) 641-8030 --Must be a resident of [place] °-- Must have NO insurance coverage whatsoever (no Medicaid/ Medicare, etc.) °-- The pt. MUST have a primary care doctor that directs their care regularly and follows them in the community °  °[facility]  (866) 331-1348   °[employer]  (888) 892-1162   ° °Agencies that provide inexpensive medical care: °Organization         Address  Phone   Notes  °[facility]  (336) 832-8035   °[facility]    (336) 832-7272   °[facility] [address] °[place], [place] [address] (336) 832-4777   °[facility] [address], °[place] (336) 271-4999   °[facility]    (336) 373-0678   °[facility]    (336) 272-1050   °[facility] ° [address], [place] Phone:  (336) 832-4444, Fax:  (336) 832-4440 Hours of Operation:  9 am - 6 pm, M-F.  Also accepts Medicaid/Medicare and self-pay.  °[facility] ° [address], [place] Phone: (336) 832-3150, Fax: (336) [phone]. Hours of Operation:  8:30 am - 5:30 pm, M-F.  Also accepts Medicaid and self-pay.  °[facility] [place] [address], [place] Phone: (336) 878-6027   °[facility] [address], [place], [place] (336)723-1848, Ext. 123 Mondays & Thursdays: 7-9 AM.  First 15 patients are seen on a first come, first serve basis. °  ° °Medicaid-accepting [place] Providers: ° °Organization         Address  Phone   Notes  °[facility] [address], [place] (336) 641-2100 Also accepts self-pay patients.  °[facility] [address], [place] ° (336) 856-9996   °[facility] [address], [place]  (336) 288-8857   °[facility] [address] [place] [address], [place] (336) 299-7000   °Veita Bland [address], [place]  ° (336) 373-1557 Only accepts [place] Access Medicaid patients after they have their name applied to their card.  ° °Self-Pay (no insurance) in [place]: ° °Organization         Address  Phone   Notes  °Sickle Cell Patients, [facility] [address], [place] (336) 832-1970   °[facility] [address], [place] (336) 832-4400   °[facility] ° [address], [place] (336) 992-4800   °[facility]/Dr. Osei-Bonsu ° [address] [place] [address], [place] or [address], [place] (336) 841-8500 Phone number for both [place] and [place] locations is the same.  °[facility] [address], [place] (336) 299-0000   °[facility] [address] [place] [address], [place] or [address] (336) 852-7530 °(336) 878-2260   °[facility] [address], [place] (336) 350-1642, phone; (336) 294-5005, fax Sees patients 1st and 3rd Saturday of every month.  Must not qualify for public or private insurance (i.e. Medicaid, Medicare, [place] Health Choice, Veterans' Benefits) • Household income should be no more than 200% of the poverty level •The clinic cannot treat you if you are pregnant or think you are pregnant • Sexually transmitted diseases are not treated at the clinic.  ° ° °Dental Care: °Organization         Address  Phone  Notes  °[place] [facility] [address], [place] (336) 641-6152 Accepts children up to age 21 who are enrolled in Medicaid or [place] Health Choice; pregnant women with a Medicaid card; and children who have applied for Medicaid or [place] Health Choice, but were declined, whose parents can pay a reduced fee at time of service.  °[place]  [facility] [place]  [address], [place] (336) 641-7733 Accepts children up to age 21 who are enrolled in Medicaid or [place] Health Choice; pregnant women with a Medicaid card; and children who have applied for Medicaid or [place] Health Choice, but were declined, whose parents can pay a reduced fee at time of service.  °[facility] ° [address], [place] (336) 641-4533 Patients are seen by appointment only. Walk-ins are not accepted. [facility] will see patients 18 years of age and older. °Monday - Tuesday (8am-5pm) °Most Wednesdays (8:30-5pm) °$30 per visit, cash only  °[facility] ° [address], [place] (336) 641-4533 Patients are seen by appointment only. Walk-ins are not accepted. [facility] will see patients 18 years of age and older. °One   Wednesday Evening (Monthly: Volunteer Based).  $30 per visit, cash only  °[facility]  (919) [phone] for adults; Children under age 4, call Graduate Pediatric Dentistry at ([phone]) [phone]. Children aged 4-14, please call ([phone]) [phone] to request a pediatric application. ° Dental services are provided in all areas of dental care including fillings, crowns and bridges, complete and partial dentures, implants, gum treatment, root canals, and extractions. Preventive care is also provided. Treatment is provided to both adults and children. °Patients are selected via a lottery and there is often a waiting list. °  °[facility] [address], °[place] ° (336) 763-8833 [email or web address] °  °[facility] [address], [place], [place] (336)723-1848, Ext. 123 Second and Fourth Thursday of each month, opens at 6:30 AM; Clinic ends at 9 AM.  Patients are seen on a first-come first-served basis, and a limited number are seen during each clinic.  ° °[facility] ° [address], [place], [place] (336) 723-7904    Eligibility Requirements °You must have lived in [place], [place], or [place] counties for at least the last three months. °  You cannot be eligible for state or federal sponsored healthcare insurance, including Veterans Administration, Medicaid, or Medicare. °  You generally cannot be eligible for healthcare insurance through your employer.  °  How to apply: °Eligibility screenings are held every Tuesday and Wednesday afternoon from 1:00 pm until 4:00 pm. You do not need an appointment for the interview!  °[facility] [address], [place], [place] 336-631-2330   °[facility]  336-342-8273   °[facility]  336-703-3100   °[facility]  336-570-6415   ° °Behavioral Health Resources in the Community: °Intensive Outpatient Programs °Organization         Address  Phone  Notes  °[facility] [address], [place], [place] 336-878-6098   °[facility] [address], [place], [place] 336-832-9800   °[facility] [address], [place], [place] ° 336-882-2125   °[facility] [address],  °[place], [place] 1-800-853-5163 or 336-641-4981   °Substance Abuse Resources °Organization         Address  Phone  Notes  °[facility]  336-882-2125   °[facility]  336-784-9470   °[facility]  336-285-9073   °[facility]  336-845-3988   °Residential & Outpatient Substance Abuse Program  1-800-659-3381   °Psychological Services °Organization         Address  Phone  Notes  °[facility]  336- 832-9600   °[facility]  336- 378-7881   °[facility] [address], [place] 1-800-853-5163 or 336-641-4981   ° °Mobile Crisis Teams °Organization         Address  Phone  Notes  °[facility], Mobile Crisis Care Unit  1-877-626-1772   °Assertive °[facility] ° [address]  [place], [place] 336-834-9664   °Sharon DeEsch [address] °[place] 336-554-5454   ° °Self-Help/Support Groups °Organization         Address  Phone             Notes  °[facility]  - variety of support groups  336- 373-1402 Call for more information  °Narcotics Anonymous (NA), [facility] [address], °[place]  2 meetings at this location  ° °  Residential Treatment Programs Organization         Address  Phone  Notes  [facility] [address],    [place]  [phone]   [facility]  [address], [place] [phone], [place], [place] [phone]   [facility] [address], [place] [phone]) [phone] Admissions: 8am-3pm M-F  [facility] [address]    [place], [place] 295-284-1324([phone]   [facility] [address], [place], [place] [phone]   [facility] [address]  [place], [place] [phone]   [facility] Intensive Outpatient [address], [place], [place] [phone]   [facility] [address]  [place], [place] [phone] or [phone]) [phone]   [facility] ([facility] [address] [place], [place] [phone] Accepts Medicaid  [facility] [address]  [place] [phone] Substance Abuse/Addiction Treatment   [facility]         Address  Phone  Notes  [facility]  332-511-3894(888) [phone]   Angie FavaJulie Brannon, PhD [address], [address] [place], [place]   ([phone]) [phone](336) [phone] or [phone](336) [phone]   [facility]   [address] [place], [place] ([phone]) [phone](336) [phone]   [facility] [address], [place], [place] [phone](336) [phone] Insurance/Medicaid/sponsorship through [facility]  [facility] [address]                                    [place], [place] [phone](336) [phone] Therapy/tele-psych/case    [facility] [address]   [place], [place] [phone](336) [phone]    Dr. Lolly MustacheArfeen  [phone](336) [phone]   [facility]  [employer] [facility] 1) [address], [place] 2) [address], [place] 3)  [address], [place] (201)693-2283(336) 505-311-4037 [phone](336) [phone]  [phone](336) [phone]   [facility] Child Abuse Hotline 7158432877(336) 706-650-7027 or [phone](336) [phone] (After Hours)       Take the prescriptions as directed.  Apply moist heat or ice to the area(s) of discomfort, for 15 minutes at a time, several times per day for the next few days.  Do not fall asleep on a heating or ice pack. Increase you fluid intake (ie: Gatorade) and eat a bland diet for the next several days. Call your regular medical doctor tomorrow to schedule a follow up appointment in the next 2 days.  Return to the Emergency Department immediately if worsening.

## 2013-12-17 ENCOUNTER — Emergency Department (HOSPITAL_COMMUNITY): Payer: Self-pay

## 2013-12-17 ENCOUNTER — Emergency Department (HOSPITAL_COMMUNITY)
Admission: EM | Admit: 2013-12-17 | Discharge: 2013-12-17 | Disposition: A | Discharge status: Home / Self Care | Payer: Self-pay | Attending: Emergency Medicine | Admitting: Emergency Medicine

## 2013-12-17 ENCOUNTER — Encounter (HOSPITAL_COMMUNITY): Payer: Self-pay | Admitting: Emergency Medicine

## 2013-12-17 DIAGNOSIS — F17200 Nicotine dependence, unspecified, uncomplicated: Secondary | ICD-10-CM | POA: Insufficient documentation

## 2013-12-17 DIAGNOSIS — Z791 Long term (current) use of non-steroidal anti-inflammatories (NSAID): Secondary | ICD-10-CM | POA: Insufficient documentation

## 2013-12-17 DIAGNOSIS — R109 Unspecified abdominal pain: Secondary | ICD-10-CM | POA: Insufficient documentation

## 2013-12-17 LAB — URINALYSIS, ROUTINE W REFLEX MICROSCOPIC
BILIRUBIN URINE: NEGATIVE
GLUCOSE, UA: NEGATIVE mg/dL
KETONES UR: NEGATIVE mg/dL
Leukocytes, UA: NEGATIVE
Nitrite: NEGATIVE
PROTEIN: NEGATIVE mg/dL
Specific Gravity, Urine: 1.01 (ref 1.005–1.030)
Urobilinogen, UA: 0.2 mg/dL (ref 0.0–1.0)
pH: 7 (ref 5.0–8.0)

## 2013-12-17 LAB — CBC WITH DIFFERENTIAL/PLATELET
BASOS ABS: 0 10*3/uL (ref 0.0–0.1)
Basophils Relative: 0 % (ref 0–1)
Eosinophils Absolute: 0.2 10*3/uL (ref 0.0–0.7)
Eosinophils Relative: 2 % (ref 0–5)
HEMATOCRIT: 37.7 % (ref 36.0–46.0)
Hemoglobin: 12.9 g/dL (ref 12.0–15.0)
LYMPHS PCT: 36 % (ref 12–46)
Lymphs Abs: 2.3 10*3/uL (ref 0.7–4.0)
MCH: 30.9 pg (ref 26.0–34.0)
MCHC: 34.2 g/dL (ref 30.0–36.0)
MCV: 90.2 fL (ref 78.0–100.0)
MONO ABS: 0.5 10*3/uL (ref 0.1–1.0)
Monocytes Relative: 7 % (ref 3–12)
NEUTROS ABS: 3.5 10*3/uL (ref 1.7–7.7)
Neutrophils Relative %: 55 % (ref 43–77)
Platelets: 195 10*3/uL (ref 150–400)
RBC: 4.18 MIL/uL (ref 3.87–5.11)
RDW: 12.6 % (ref 11.5–15.5)
WBC: 6.5 10*3/uL (ref 4.0–10.5)

## 2013-12-17 LAB — COMPREHENSIVE METABOLIC PANEL
ALK PHOS: 70 U/L (ref 39–117)
ALT: 20 U/L (ref 0–35)
AST: 25 U/L (ref 0–37)
Albumin: 3.8 g/dL (ref 3.5–5.2)
Anion gap: 10 (ref 5–15)
BILIRUBIN TOTAL: 0.2 mg/dL — AB (ref 0.3–1.2)
BUN: 11 mg/dL (ref 6–23)
CHLORIDE: 103 meq/L (ref 96–112)
CO2: 27 meq/L (ref 19–32)
Calcium: 8.9 mg/dL (ref 8.4–10.5)
Creatinine, Ser: 0.76 mg/dL (ref 0.50–1.10)
GFR calc Af Amer: >90 mL/min (ref >90)
Glucose, Bld: 87 mg/dL (ref 70–99)
POTASSIUM: 3.8 meq/L (ref 3.7–5.3)
SODIUM: 140 meq/L (ref 137–147)
Total Protein: 7.3 g/dL (ref 6.0–8.3)

## 2013-12-17 LAB — URINE MICROSCOPIC-ADD ON

## 2013-12-17 MED ORDER — SODIUM CHLORIDE 0.9 % IV BOLUS (SEPSIS)
1000.0000 mL | Freq: Once | INTRAVENOUS | Status: AC
  Administered 2013-12-17: 1000 mL via INTRAVENOUS

## 2013-12-17 MED ORDER — MORPHINE SULFATE 4 MG/ML IJ SOLN
4.0000 mg | Freq: Once | INTRAMUSCULAR | Status: AC
Start: 2013-12-17 — End: 2013-12-17
  Administered 2013-12-17: 4 mg via INTRAVENOUS
  Filled 2013-12-17: qty 1

## 2013-12-17 MED ORDER — ONDANSETRON HCL 4 MG/2ML IJ SOLN
4.0000 mg | Freq: Once | INTRAMUSCULAR | Status: AC
  Administered 2013-12-17: 4 mg via INTRAVENOUS
  Filled 2013-12-17: qty 2

## 2013-12-17 MED ORDER — IOHEXOL 300 MG/ML  SOLN
100.0000 mL | Freq: Once | INTRAMUSCULAR | Status: AC | PRN
  Administered 2013-12-17: 100 mL via INTRAVENOUS

## 2013-12-17 MED ORDER — KETOROLAC TROMETHAMINE 30 MG/ML IJ SOLN
30.0000 mg | Freq: Once | INTRAMUSCULAR | Status: AC
  Administered 2013-12-17: 30 mg via INTRAVENOUS
  Filled 2013-12-17: qty 1

## 2013-12-17 NOTE — ED Notes (Signed)
Patient verbalizes understanding of discharge instructions and follow up care. Patient ambulatory out of department at this time with family.

## 2013-12-17 NOTE — ED Provider Notes (Signed)
This chart was scribed for Lori MawKristen N Regis Hinton, DO by Milly JakobJohn Lee Graves, ED Scribe. The patient was seen in room APA04/APA04. Patient's care was started at 7:37 PM.  CHIEF COMPLAINT: Flank Pain  HPI:  Lori BargesDanielle A Zmuda is a 23 y.o. female without any significant medical problems who presents to the Emergency Department complaining of constant, severe right flank pain. She reports that two days ago she fell down eight steps and hit her right flank area. She has had pain that is worse with movement and palpation and better with rest. No radiation. She states that she is also had nausea and vomiting that started yesterday and has had multiple episodes and now feels lightheaded and has been "seeing spots" and feeling like she may pass out. No chest pain or shortness of breath. She did not have any head injury. She is not on anticoagulation. No numbness, tingling or focal weakness. She was in the ED yesterday and had normal labs, urine and did have leukocytes and hemoglobin but no other sign of infection. She had negative right rib series and chest x-ray, negative abdominal ultrasound. She was treated medically for possible renal stone. She is back today but she states she is feeling worse in her oral pain medication and nausea medicine is not controlling her symptoms. Denies a prior history of kidney stones. Denies a prior history of abdominal surgery.    ROS: See HPI Constitutional: no fever  Eyes: no drainage  ENT: no runny nose   Cardiovascular:  no chest pain  Resp: no SOB  GI: no vomiting GU: no dysuria, right flank pain Integumentary: no rash  Allergy: no hives  Musculoskeletal: no leg swelling  Neurological: no slurred speech ROS otherwise negative  PAST MEDICAL HISTORY/PAST SURGICAL HISTORY:  History reviewed. No pertinent past medical history.  MEDICATIONS:  Prior to Admission medications   Medication Sig Start Date End Date Taking? Authorizing Provider  HYDROcodone-acetaminophen  (NORCO/VICODIN) 5-325 MG per tablet Take 1-2 tablets by mouth every 6 (six) hours as needed for moderate pain or severe pain. 12/16/13  Yes Samuel JesterKathleen McManus, DO  ibuprofen (ADVIL,MOTRIN) 200 MG tablet Take 400-600 mg by mouth every 6 (six) hours as needed for pain.   Yes Historical Provider, MD  naproxen (NAPROSYN) 250 MG tablet Take 1 tablet (250 mg total) by mouth 2 (two) times daily as needed for mild pain or moderate pain (take with food). 12/16/13  Yes Samuel JesterKathleen McManus, DO  ondansetron (ZOFRAN) 4 MG tablet Take 1 tablet (4 mg total) by mouth every 8 (eight) hours as needed for nausea or vomiting. 12/16/13  Yes Samuel JesterKathleen McManus, DO    ALLERGIES:  No Known Allergies  SOCIAL HISTORY:  History  Substance Use Topics  . Smoking status: Current Every Day Smoker -- 0.50 packs/day    Types: Cigarettes  . Smokeless tobacco: Not on file  . Alcohol Use: No    FAMILY HISTORY: No family history on file.  EXAM: Triage Vitals: BP 135/81  Pulse 90  Temp(Src) 97.7 F (36.5 C) (Oral)  Resp 24  Ht 5\' 1"  (1.549 m)  Wt 178 lb (80.74 kg)  BMI 33.65 kg/m2  SpO2 100%  LMP 12/12/2013  CONSTITUTIONAL: Alert and oriented and responds appropriately to questions. Well-appearing; well-nourished HEAD: Normocephalic EYES: Conjunctivae clear, PERRL ENT: normal nose; no rhinorrhea; moist mucous membranes; pharynx without lesions noted NECK: Supple, no meningismus, no LAD  CARD: RRR; S1 and S2 appreciated; no murmurs, no clicks, no rubs, no gallops RESP: Normal chest excursion  without splinting or tachypnea; breath sounds clear and equal bilaterally; no wheezes, no rhonchi, no rales,  ABD/GI: Normal bowel sounds; non-distended; soft, non-tender, no rebound, no guarding BACK:  The back appears normal and is non-tender to palpation,  has some mild right CVA tenderness, no lesions noted on the back, no midline spinal tenderness or step-off or deformity  EXT: Normal ROM in all joints; non-tender to palpation; no  edema; normal capillary refill; no cyanosis    SKIN: Normal color for age and race; warm NEURO: Moves all extremities equally sensation to light touch intact diffusely, cranial nerves II through XII intact, normal gait  PSYCH: The patient's mood and manner are appropriate. Grooming and personal hygiene are appropriate.  MEDICAL DECISION MAKING:  patient here with right flank pain that was in the setting of injury but does have hemoglobin and calcium oxylate crystals in her urine. She reports she has not been able to control her pain at home with oral medications. Given she has never had a kidney stone before she did have a history of trauma, will obtain a CT of her abdomen and pelvis with IV contrast to evaluate for possible injury versus stone. We'll repeat labs given patient reports she has had greater than 10 episodes of vomiting in the past 24 hours. We'll give IV fluids, Toradol, Zofran and morphine and reassess.  ED PROGRESS: Patient's repeat labs are completely unremarkable. Urine shows trace hemoglobin but no other sign of infection. Her CT scan shows no injury and there is no renal stone. She's not had any vomiting in the ED and appears very comfortable and nontoxic. She is hemodynamically stable. I feel she is safe to be discharged home. Given she was just discharged with pain medication, I do not feel she needs a refill at this time. Have discussed with her at this point she needs to followup with a primary care physician as this is now her fourth visit to the emergency department for back pain. Discussed supportive care instructions and return precautions. She verbalized understanding and is comfortable with plan.   Lori Barnes Desirey Keahey, DO 12/17/13 [id number]

## 2013-12-17 NOTE — Discharge Instructions (Signed)
Your labs today were completely normal. Urine shows no sign of infection and only small amount of blood. Your CT scan showed no abnormality or sign of injury. You have had normal x-rays and ultrasound. ED to establish care with a primary care physician who can now manage your pain.   Flank Pain Flank pain refers to pain that is located on the side of the body between the upper abdomen and the back. The pain may occur over a short period of time (acute) or may be long-term or reoccurring (chronic). It may be mild or severe. Flank pain can be caused by many things. CAUSES  Some of the more common causes of flank pain include:  Muscle strains.   Muscle spasms.   A disease of your spine (vertebral disk disease).   A lung infection (pneumonia).   Fluid around your lungs (pulmonary edema).   A kidney infection.   Kidney stones.   A very painful skin rash caused by the chickenpox virus (shingles).   Gallbladder disease.  HOME CARE INSTRUCTIONS  Home care will depend on the cause of your pain. In general,  Rest as directed by your caregiver.  Drink enough fluids to keep your urine clear or pale yellow.  Only take over-the-counter or prescription medicines as directed by your caregiver. Some medicines may help relieve the pain.  Tell your caregiver about any changes in your pain.  Follow up with your caregiver as directed. SEEK IMMEDIATE MEDICAL CARE IF:   Your pain is not controlled with medicine.   You have new or worsening symptoms.  Your pain increases.   You have abdominal pain.   You have shortness of breath.   You have persistent nausea or vomiting.   You have swelling in your abdomen.   You feel faint or pass out.   You have blood in your urine.  You have a fever or persistent symptoms for more than 2-3 days.  You have a fever and your symptoms suddenly get worse. MAKE SURE YOU:   Understand these instructions.  Will watch your  condition.  Will get help right away if you are not doing well or get worse. Document Released: 04/27/2005 Document Revised: 11/29/2011 Document Reviewed: 10/19/2011 [facility] Patient Information 2015 [place], [place]. This information is not intended to replace advice given to you by your health care provider. Make sure you discuss any questions you have with your health care provider.     Emergency Department Resource Guide 1) Find a Doctor and Pay Out of Pocket Although you won't have to find out who is covered by your insurance plan, it is a good idea to ask around and get recommendations. You will then need to call the office and see if the doctor you have chosen will accept you as a new patient and what types of options they offer for patients who are self-pay. Some doctors offer discounts or will set up payment plans for their patients who do not have insurance, but you will need to ask so you aren't surprised when you get to your appointment.  2) Contact Your Local Health Department Not all health departments have doctors that can see patients for sick visits, but many do, so it is worth a call to see if yours does. If you don't know where your local health department is, you can check in your phone book. The CDC also has a tool to help you locate your state's health department, and many state websites also have listings  of all of their local health departments.  3) Find a Walk-in Clinic If your illness is not likely to be very severe or complicated, you may want to try a walk in clinic. These are popping up all over the country in pharmacies, drugstores, and shopping centers. They're usually staffed by nurse practitioners or physician assistants that have been trained to treat common illnesses and complaints. They're usually fairly quick and inexpensive. However, if you have serious medical issues or chronic medical problems, these are probably not your best option.  No Primary Care  Doctor: - Call Health Connect at  [phone] - they can help you locate a primary care doctor that  accepts your insurance, provides certain services, etc. - Physician Referral Service- [phone]  Chronic Pain Problems: Organization         Address  Phone   Notes  [facility]  [phone] Patients need to be referred by their primary care doctor.   Medication Assistance: Organization         Address  Phone   Notes  [facility] [place] Medication [facility] [address] [place], [place] [address] [phone] --Must be a resident of [facility] -- Must have NO insurance coverage whatsoever (no Medicaid/ Medicare, etc.) -- The pt. MUST have a primary care doctor that directs their care regularly and follows them in the community   MedAssist  [phone]) [phone]   [employer]  [phone]    Agencies that provide inexpensive medical care: Organization         Address  Phone   Notes  [facility]  [phone]) [phone]   [facility]    [phone]   [facility] [address] [place], [place] [address] [phone]   [facility] [place] [address], [place] [phone]) [phone]   Planned Parenthood    [phone]   [facility]    [phone]   Community Health and [facility]  [address], [place] Phone:  [phone]) [phone], Fax:  [phone]) [phone] Hours of Operation:  9 am - 6 pm, M-F.  Also accepts Medicaid/Medicare and self-pay.  [facility]  [address], [place] Phone: [phone], Fax: [phone]. Hours of Operation:  8:30 am - 5:30 pm, M-F.  Also accepts Medicaid and self-pay.  [facility] [place] [address], [place] Phone: [phone]   [facility] [address], [place], [place] [phone], Ext. [phone] Mondays & Thursdays: 7-9 AM.  First 15 patients are seen on a first  come, first serve basis.    Medicaid-accepting [facility] Providers:  Organization         Address  Phone   Notes  [facility] [address], [place] [phone] Also accepts self-pay patients.  [facility] [address], [place]  [phone]   [facility] [address], [place] [phone]) [phone]   [facility] [address] [place] [address], [place] [phone]) [phone]   Renaye RakersVeita Bland [address], [place]   [phone] Only accepts [place] Access [place]icaid patients after they have their name applied to their card.   Self-Pay (no insurance) in [facility] [place]:  Retail buyerrganization         Address  Phone   Notes  Sickle Cell Patients, [facility] [address], [place] [phone]   [facility] [address], [place] [phone]   [facility] [place]  [address], [place] [phone]   [facility]/Dr. Osei-Bonsu  [address] [place] [address], [place] or [id number], [place] [phone]) [phone] Phone number for both [place] and [place] locations is the same.  [facility] and [facility] [address], [place] [phone]   [facility] [address] [place] [address], [place] or [address] [phone]   [facility] [address], [place] [phone]) [phone], phone; [phone]) [phone], fax Sees patients 1st and 3rd Saturday of every month.  Must not qualify for public or private insurance (i.e. Medicaid, Medicare, [place] Health Choice, Veterans' Benefits)  Household income should be no more than 200% of the poverty level The clinic cannot treat you if you are pregnant or think you are pregnant  Sexually transmitted diseases are not treated at the clinic.    Dental Care: Organization          Address  Phone  Notes  [facility] Department of [facility] [address], [place] [phone] Accepts children up to age 23 who are enrolled in [place]icaid or [place] Health Choice; pregnant women with a Medicaid card; and children who have applied for Medicaid or [place] Health Choice, but were declined, whose parents can pay a reduced fee at time of service.  [facility] of [facility] [place]  [address], [place] [phone] Accepts children up to age 23 who are enrolled in [place]icaid or [place] Health Choice; pregnant women with a Medicaid card; and children who have applied for Medicaid or [place] Health Choice, but were declined, whose parents can pay a reduced fee at time of service.  [facility]  [address], [place] [phone]) [phone] Patients are seen by appointment only. Walk-ins are not accepted. [facility] will see patients 23 years of age and older. Monday - Tuesday (8am-5pm) Most Wednesdays (8:30-5pm) $30 per visit, cash only  [facility]  [address], [facility] [phone]) [phone] Patients are seen by appointment only. Walk-ins are not accepted. [facility] will see patients 23 years of age and older. One Wednesday Evening (Monthly: Volunteer Based).  $30 per visit, cash only  [employer] [facility]  [phone] for adults; Children under age 584, call Graduate Pediatric Dentistry at [phone]. Children aged 644-14, please call [phone] to request a pediatric application.  Dental services are provided in all areas of dental care including fillings, crowns and bridges, complete and partial dentures, implants, gum treatment, root canals, and extractions. Preventive care is also provided. Treatment is provided to both adults and children. Patients are selected via a lottery and there is often a waiting list.   [facility] [address], [place]  [phone] [email or web address]   [facility] [address], [place], [place] [phone], Ext. [phone] Second and Fourth Thursday of each month, opens at 6:30 AM; Clinic ends at 9 AM.  Patients are seen on a first-come first-served basis, and a limited number are seen during each clinic.   [facility]  [address], [place], [place] [phone]   Eligibility  Requirements You must have lived in [place], [place], or [place] for at least the last three months.   You cannot be eligible for state or federal sponsored National Cityhealthcare insurance, including CIGNAVeterans Administration, [place]icaid, or Harrah[employer] EntertainmentMedicare.   You generally cannot be eligible for healthcare insurance through your employer.    How to apply: Eligibility screenings are held every Tuesday and Wednesday afternoon from 1:00 pm until 4:00 pm. You do not need an appointment for the interview!  [address] [facility] [address], [place], [place] [phone]   [facility]  [phone]   [facility]  [phone]   [facility]  [phone]    Behavioral Health Resources in the Community: Intensive Outpatient Programs Organization         Address  Phone  Notes  [facility] [address], [place], [place] [phone]   [facility] [address], [place], [place] [phone]   [facility]: [facility] Drug S[facility] [address], [place], [place]  [phone]   [facility] [address],  [place], [place] [phone] or [phone]   Substance Abuse Resources Organization         Address  Phone  Notes  [facility]  [phone]   [facility]  [phone]   [facility]  [phone]   [facility]  [phone]   Residential & Outpatient Substance Abuse Program  [phone]   Psychological  Services Organization         Address  Phone  Notes  [facility]  [phone]   [facility]  [phone]- [phone]   [facility] [address], [place] [phone] or [phone]    Mobile Crisis Teams Organization         Address  Phone  Notes  [facility], Mobile Crisis Care Unit  [phone]   [facility]  [address] [place], [place] [phone]   Doristine LocksSharon DeEsch [address] [place] [phone]    Self-Help/Support Groups Organization         Address  Phone             Notes  Mental Health Assoc. of Social Circle - variety of support groups  [phone]- [phone] Call for more information  Narcotics Anonymous (NA), [facility]aring [facility] [address], [place]  2 meetings at this location   Statisticianesidential Treatment Programs Organization         Address  Phone  Notes  [facility] [address],    [place]  [phone]   [facility]  [address], [place] [phone], [place], [place] [phone]   [facility] [address], [place] [phone] Admissions: 8am-3pm M-F  [facility] [address]    [place], [place] [phone]   [facility] [address], [place], [place] [phone]   [facility] [address]  [place], [place] [phone]   [facility] - Intensive Outpatient [address], [place], [place] [phone]   [facility] [address]  [place], [place] [phone] or [phone]   [facility] (RTS) [address] [place], [place] [phone] Accepts Medicaid  [facility] [address]  [place] [phone] Substance Abuse/Addiction Treatment   [facility] Behavioral Health Resources Organization  Address  Phone  Notes  [facility]  [phone]   Domenic Schwab, PhD [address] Porta [place], [place]   [phone] or [phone]   [address] [place], [place] [phone]   [address], [place], [place] [phone] Insurance/Medicaid/sponsorship through [facility] and Families [address]                                    [place], [place] [id number] [address], [place] [phone]    Dr. Adele Schilder  [phone]   [facility] [place]. 1) [address] [place] 2) [place] 3)  [address], [place] [phone]  [phone]   [place] [phone] or [phone] (After Hours)
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
## 2013-12-17 NOTE — ED Notes (Signed)
Pt seen in ED for same complaints, pt states nausea medication and pain medication are not controlling her symptoms.

## 2015-09-24 IMAGING — CR DG RIBS 2V*R*
3 series · 3 of 3 positions shown · non-contrast
Comparison: None.

EXAM:
RIGHT RIBS - 2 VIEW

[view not recorded (1 of 3)]
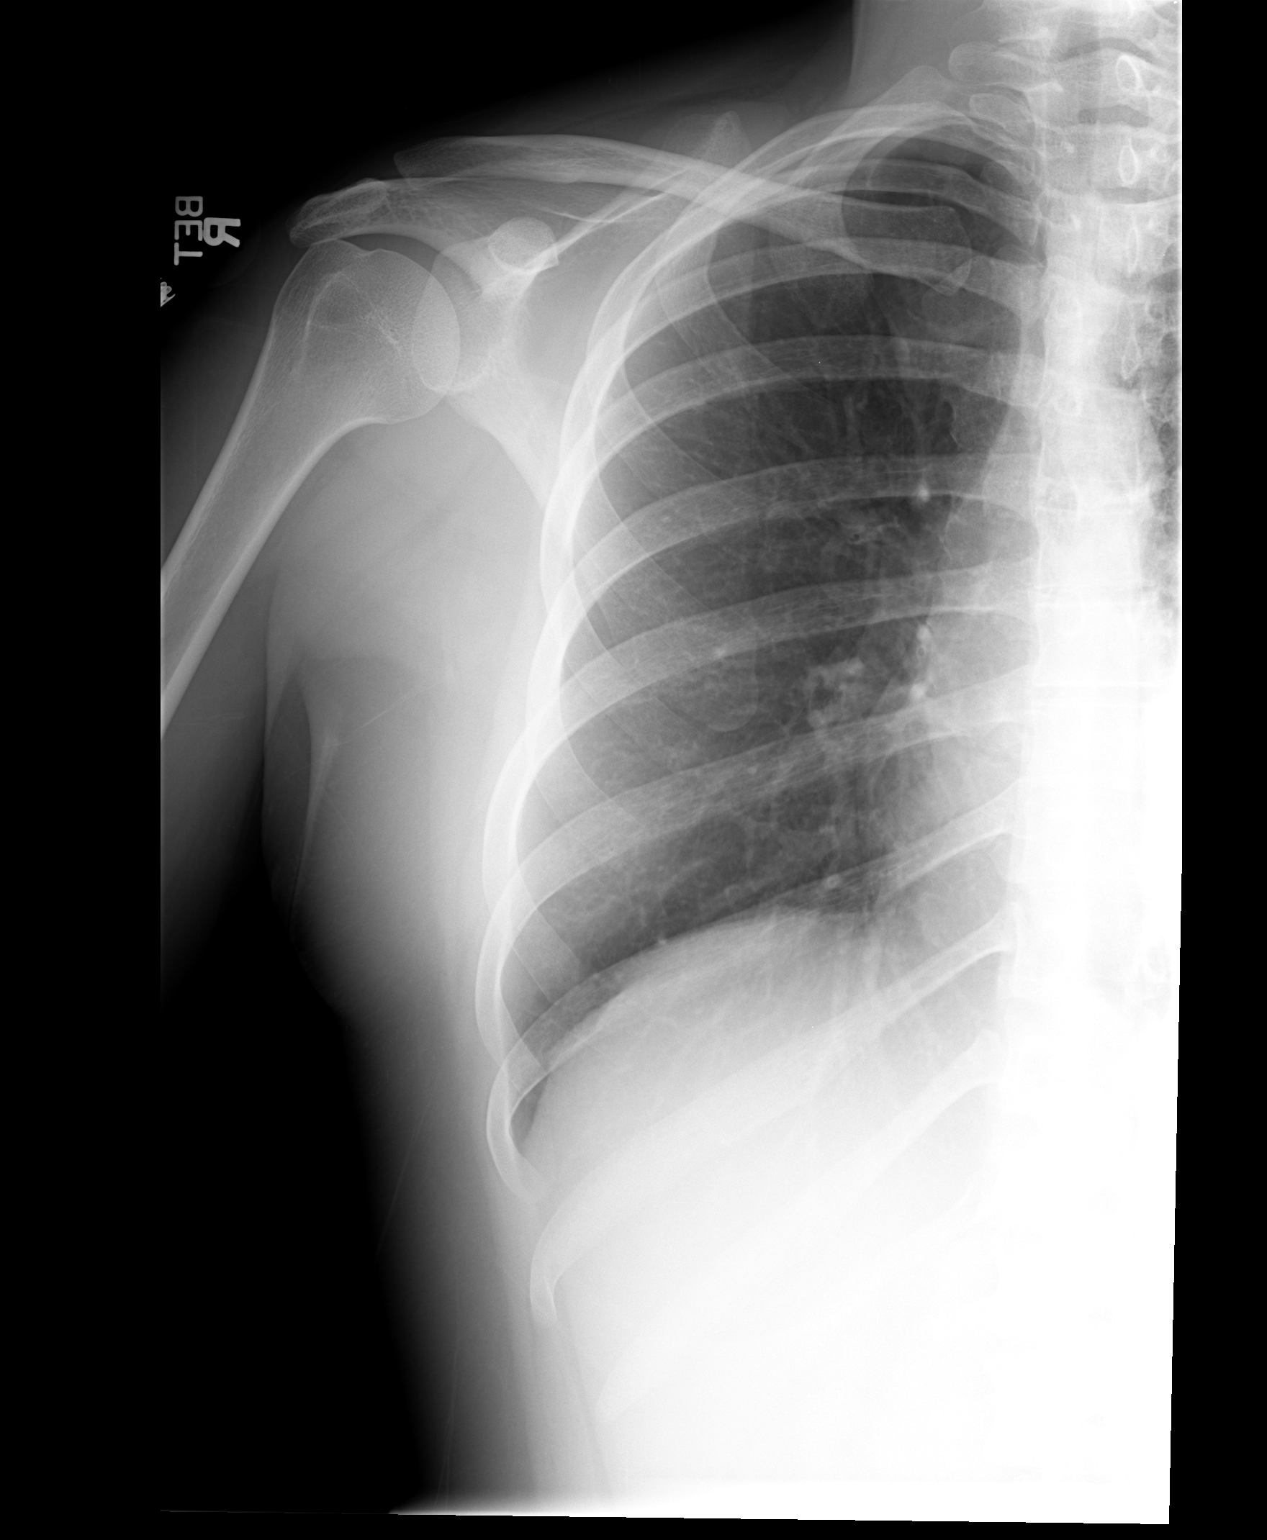

[view not recorded (2 of 3)]
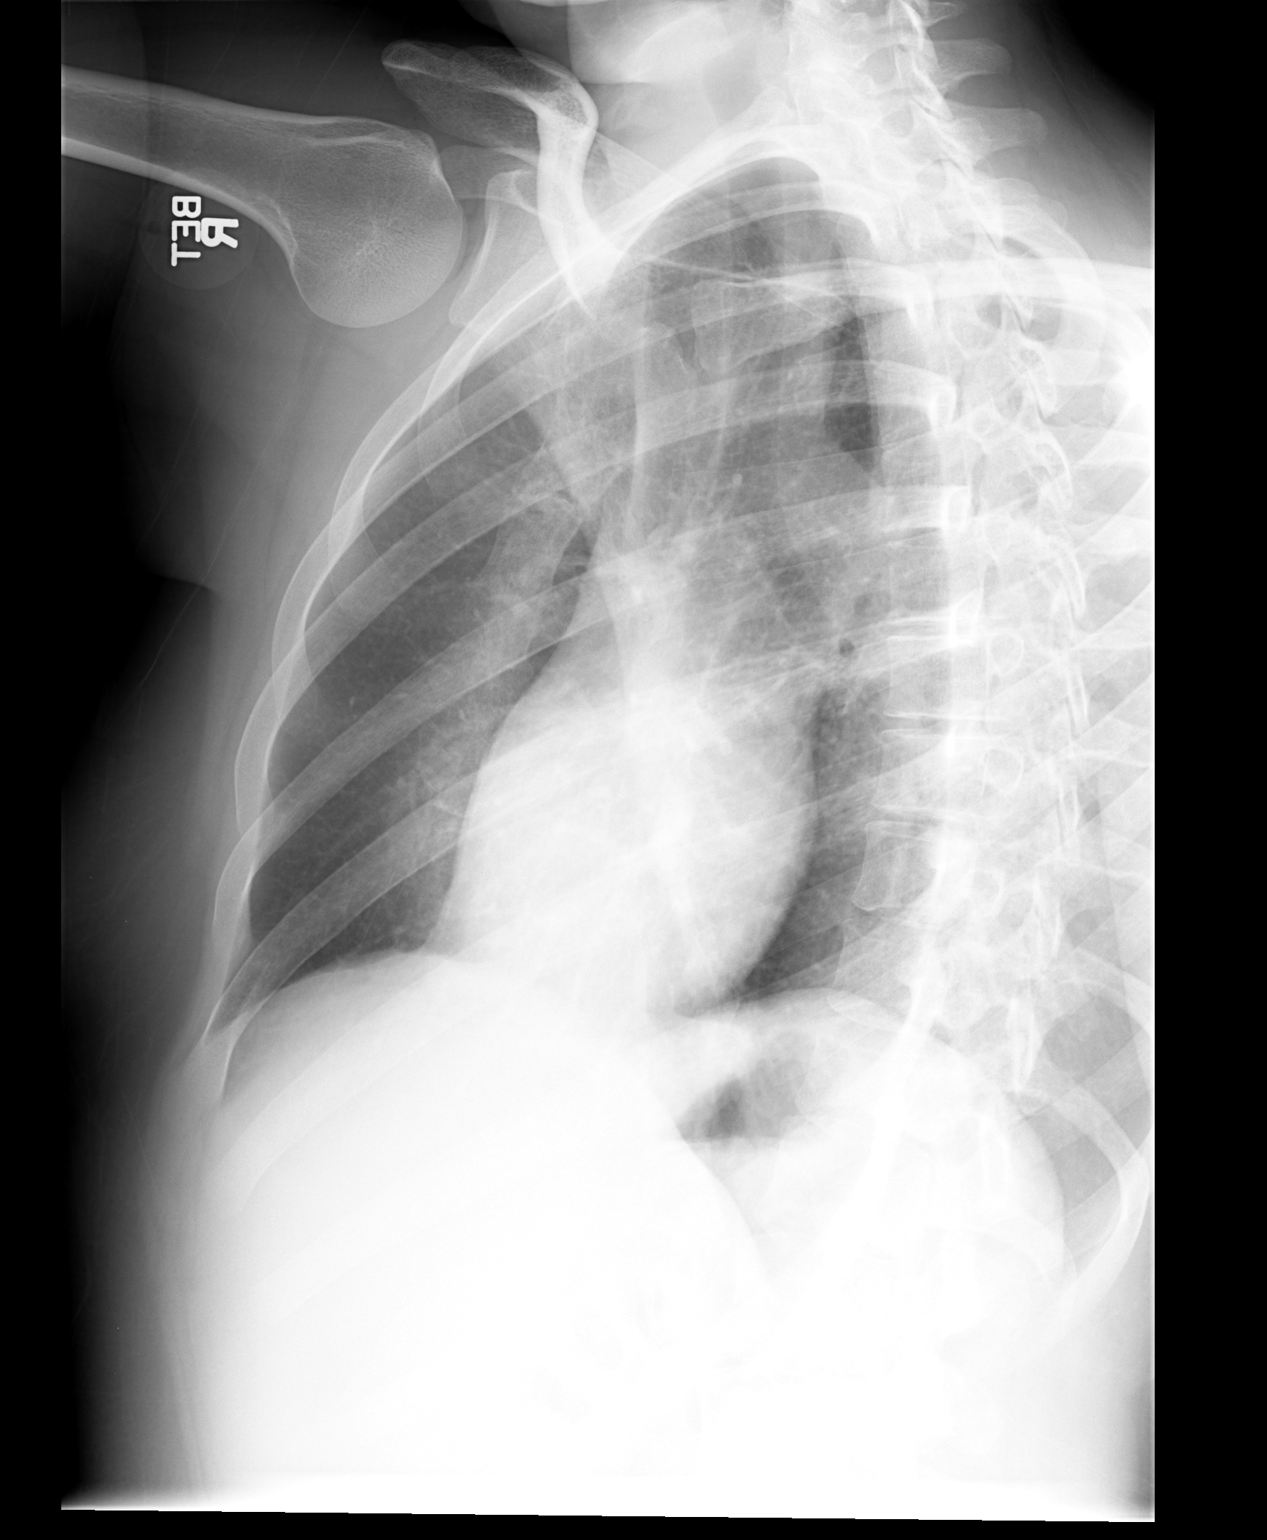

[view not recorded (3 of 3)]
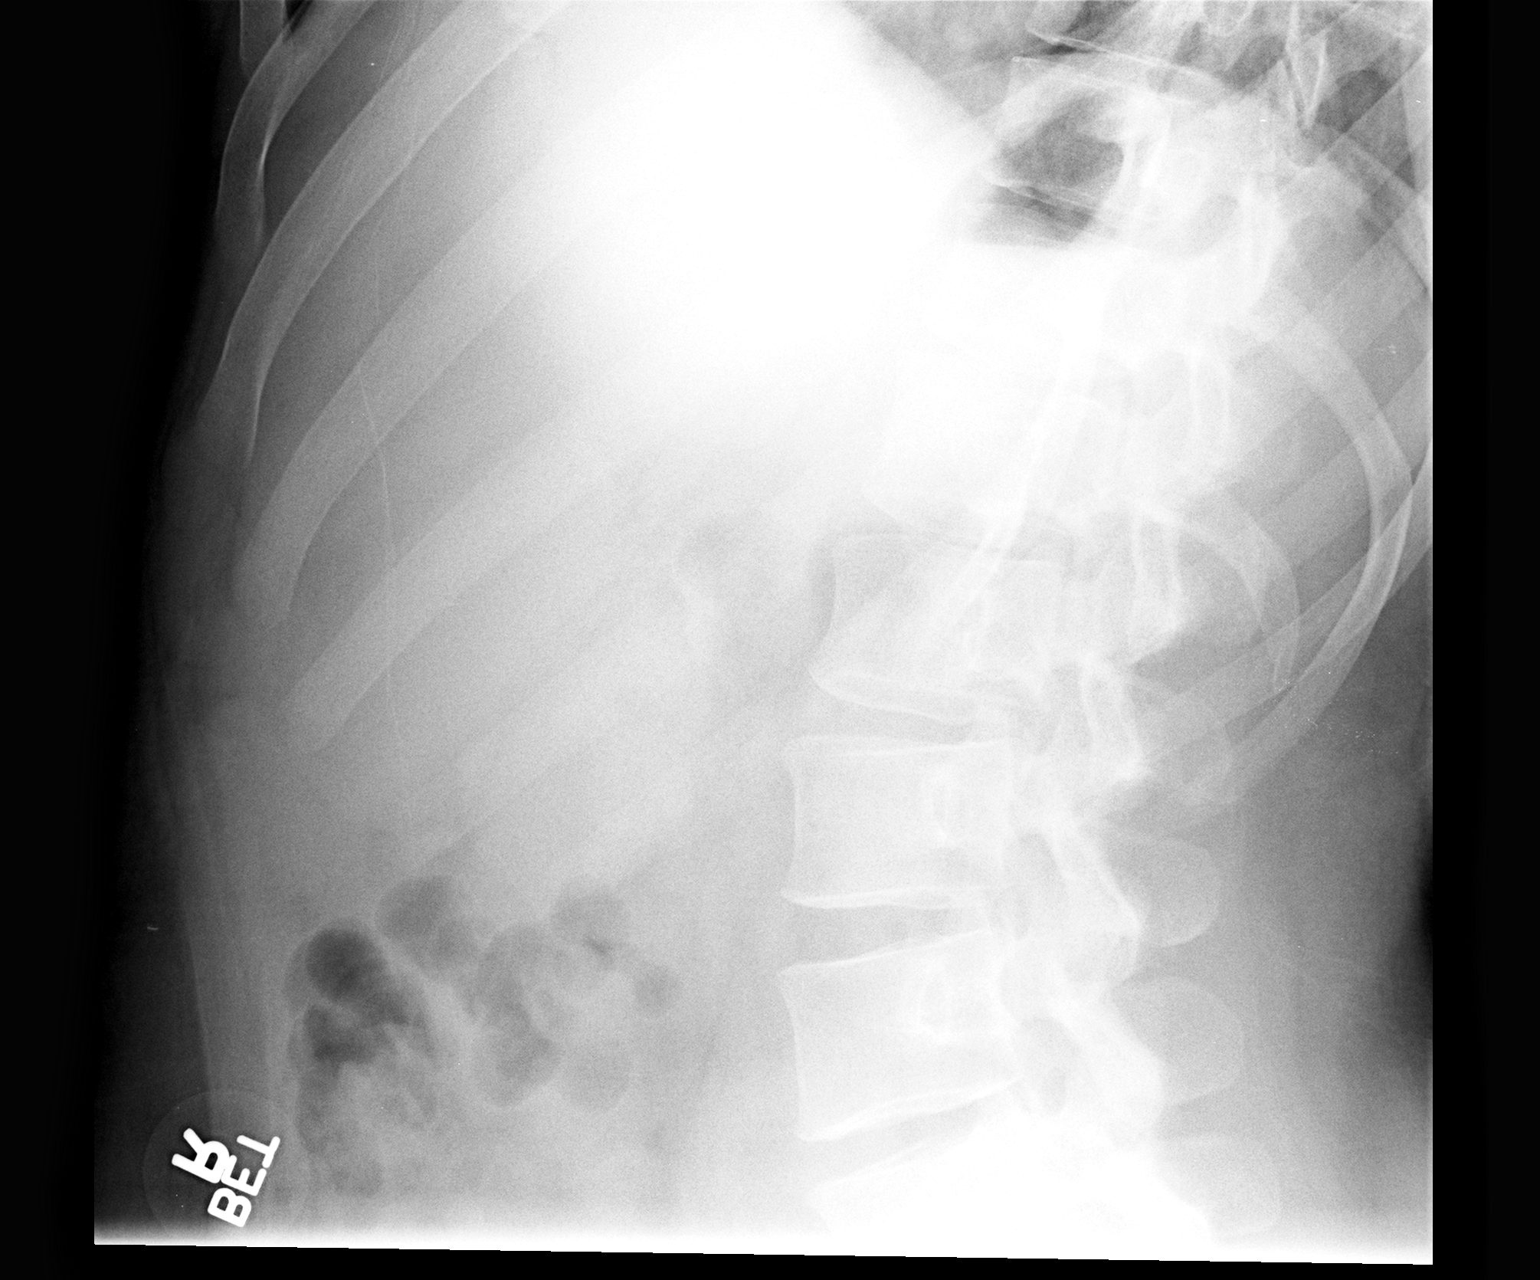

[3 of 3 positions shown; findings below may reference images not displayed]

FINDINGS: No fracture or other bone lesions are seen involving the ribs.
IMPRESSION: Negative.

## 2017-01-30 DIAGNOSIS — N393 Stress incontinence (female) (male): Secondary | ICD-10-CM

## 2017-01-30 DIAGNOSIS — Z139 Encounter for screening, unspecified: Secondary | ICD-10-CM

## 2017-01-30 LAB — POCT URINALYSIS DIPSTICK
BILIRUBIN UA: NEGATIVE
Glucose, UA: NEGATIVE
Ketones, UA: NEGATIVE
Leukocytes, UA: NEGATIVE
Nitrite, UA: NEGATIVE
PH UA: 7 (ref 5.0–8.0)
Protein, UA: 30
Urobilinogen, UA: 0.2 E.U./dL

## 2017-01-30 LAB — POCT URINE PREGNANCY: Preg Test, Ur: NEGATIVE

## 2017-01-30 LAB — GLUCOSE, POCT (MANUAL RESULT ENTRY): POC Glucose: 104 mg/dL — AB (ref 70–99)
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
## 2017-02-01 ENCOUNTER — Telehealth: Payer: Self-pay

## 2017-02-01 ENCOUNTER — Ambulatory Visit: Payer: Self-pay | Admitting: Physician Assistant

## 2017-02-01 NOTE — Telephone Encounter (Signed)
Called to follow up with client after scheduled 01/31/17 at health department STD screening appointment and  02/01/17 [facility] [place]. No answer message left on voicemail for client to return call as soon as available.

## 2017-02-12 ENCOUNTER — Encounter: Payer: Self-pay | Admitting: Physician Assistant

## 2018-05-13 ENCOUNTER — Encounter (HOSPITAL_COMMUNITY): Payer: Self-pay | Admitting: Emergency Medicine

## 2018-05-13 ENCOUNTER — Emergency Department (HOSPITAL_COMMUNITY)
Admission: EM | Admit: 2018-05-13 | Discharge: 2018-05-14 | Disposition: A | Discharge status: Home / Self Care | Payer: Medicaid Other | Attending: Emergency Medicine | Admitting: Emergency Medicine

## 2018-05-13 ENCOUNTER — Other Ambulatory Visit: Payer: Self-pay

## 2018-05-13 DIAGNOSIS — Z79899 Other long term (current) drug therapy: Secondary | ICD-10-CM | POA: Insufficient documentation

## 2018-05-13 DIAGNOSIS — O219 Vomiting of pregnancy, unspecified: Secondary | ICD-10-CM | POA: Diagnosis not present

## 2018-05-13 DIAGNOSIS — R1110 Vomiting, unspecified: Secondary | ICD-10-CM

## 2018-05-13 DIAGNOSIS — Z3A00 Weeks of gestation of pregnancy not specified: Secondary | ICD-10-CM | POA: Diagnosis not present

## 2018-05-13 DIAGNOSIS — Z3A01 Less than 8 weeks gestation of pregnancy: Secondary | ICD-10-CM | POA: Diagnosis not present

## 2018-05-13 MED ORDER — METOCLOPRAMIDE HCL 10 MG PO TABS
10.0000 mg | ORAL_TABLET | Freq: Once | ORAL | Status: AC
  Administered 2018-05-14: 10 mg via ORAL
  Filled 2018-05-13: qty 1

## 2018-05-13 NOTE — ED Triage Notes (Signed)
Pt states she has been vomiting for last 48hrs and lower abdominal pain. States she called her OBgyn who said just to wait until her appt on Thursday (pt is [redacted] weeks pregnant) but pt states she cannot keep anything down. Pt also states she has been running a fever as well but has been taking Tylenol at home. Tmaxx was 101 at home. Last dose was 2030.

## 2018-05-14 LAB — URINALYSIS, ROUTINE W REFLEX MICROSCOPIC
Bilirubin Urine: NEGATIVE
Glucose, UA: NEGATIVE mg/dL
Ketones, ur: NEGATIVE mg/dL
Leukocytes,Ua: NEGATIVE
Nitrite: NEGATIVE
PROTEIN: 30 mg/dL — AB
Specific Gravity, Urine: 1.027 (ref 1.005–1.030)
pH: 6 (ref 5.0–8.0)

## 2018-05-14 LAB — GROUP A STREP BY PCR: GROUP A STREP BY PCR: NOT DETECTED

## 2018-05-14 NOTE — ED Provider Notes (Signed)
[facility] EMERGENCY DEPARTMENT Provider Note   CSN: [id number] Arrival date & time: 05/13/18  2310    History   Chief Complaint Chief Complaint  Patient presents with  . Emesis    HPI Lori Barnes is a 28 y.o. female.     The history is provided by the patient. No language interpreter was used.  Emesis  Severity:  Moderate Timing:  Constant Progression:  Worsening Chronicity:  New Recent urination:  Normal Relieved by:  Nothing Worsened by:  Nothing Ineffective treatments:  None tried Associated symptoms: no abdominal pain   Risk factors: pregnant   Risk factors: no sick contacts   Pt complains of nausea and vomittng today.   Pt worried that she has strep.    History reviewed. No pertinent past medical history.  There are no active problems to display for this patient.   Past Surgical History:  Procedure Laterality Date  . LEEP       OB History    Gravida  1   Para      Term      Preterm      AB      Living        SAB      TAB      Ectopic      Multiple      Live Births               Home Medications    Prior to Admission medications   Medication Sig Start Date End Date Taking? Authorizing Provider  Prenatal Vit-Fe Fumarate-FA (PRENATAL MULTIVITAMIN) TABS tablet Take 1 tablet by mouth daily at 12 noon.   Yes [provider]  HYDROcodone-acetaminophen (NORCO/VICODIN) 5-325 MG per tablet Take 1-2 tablets by mouth every 6 (six) hours as needed for moderate pain or severe pain. 12/16/13   Samuel Jester, DO  ibuprofen (ADVIL,MOTRIN) 200 MG tablet Take 400-600 mg by mouth every 6 (six) hours as needed for pain.    [provider]  naproxen (NAPROSYN) 250 MG tablet Take 1 tablet (250 mg total) by mouth 2 (two) times daily as needed for mild pain or moderate pain (take with food). 12/16/13   Samuel Jester, DO  ondansetron (ZOFRAN) 4 MG tablet Take 1 tablet (4 mg total) by mouth every 8 (eight) hours as needed for  nausea or vomiting. 12/16/13   Samuel Jester, DO    Family History No family history on file.  Social History Social History   Tobacco Use  . Smoking status: Current Every Day Smoker    Packs/day: 1.50    Years: 6.00    Pack years: 9.00    Types: Cigarettes  . Smokeless tobacco: Never Used  Substance Use Topics  . Alcohol use: No  . Drug use: No     Allergies   Patient has no known allergies.   Review of Systems Review of Systems  Gastrointestinal: Positive for vomiting. Negative for abdominal pain.  All other systems reviewed and are negative.    Physical Exam Updated Vital Signs BP 129/73 (BP Location: Right Arm)   Pulse 95   Temp 98.3 F (36.8 C) (Oral)   Resp 18   Ht 5\' 2"  (1.575 m)   Wt 72.6 kg   SpO2 99%   BMI 29.26 kg/m   Physical Exam Vitals signs and nursing note reviewed.  Constitutional:      Appearance: She is well-developed.  HENT:     Head: Normocephalic.  Right Ear: Tympanic membrane normal.     Left Ear: Tympanic membrane normal.     Nose: Nose normal.     Mouth/Throat:     Mouth: Mucous membranes are moist.  Eyes:     Pupils: Pupils are equal, round, and reactive to light.  Neck:     Musculoskeletal: Normal range of motion.  Cardiovascular:     Rate and Rhythm: Normal rate.  Pulmonary:     Effort: Pulmonary effort is normal.  Abdominal:     General: There is no distension.     Palpations: Abdomen is soft.  Musculoskeletal: Normal range of motion.  Skin:    General: Skin is warm.  Neurological:     Mental Status: She is alert and oriented to person, place, and time.  Psychiatric:        Mood and Affect: Mood normal.      ED Treatments / Results  Labs (all labs ordered are listed, but only abnormal results are displayed) Labs Reviewed  GROUP A STREP BY PCR  URINALYSIS, ROUTINE W REFLEX MICROSCOPIC    EKG None  Radiology No results found.  Procedures Procedures (including critical care  time)  Medications Ordered in ED Medications  metoCLOPramide (REGLAN) tablet 10 mg (10 mg Oral Given 05/14/18 0008)     Initial Impression / Assessment and Plan / ED Course  I have reviewed the triage vital signs and the nursing notes.  Pertinent labs & imaging results that were available during my care of the patient were reviewed by me and considered in my medical decision making (see chart for details).        Pt given reglan po.  Pt reports improvement with medication   Final Clinical Impressions(s) / ED Diagnoses   Final diagnoses:  Vomiting, intractability of vomiting not specified, presence of nausea not specified, unspecified vomiting type    ED Discharge Orders    None     An After Visit Summary was printed and given to the patient.    Osie Cheeks 05/14/18 David Stall    Linwood Dibbles, MD 05/16/18 [id number]

## 2018-05-14 NOTE — Discharge Instructions (Signed)
Return if any problems.

## 2018-05-15 ENCOUNTER — Other Ambulatory Visit: Payer: Self-pay | Admitting: Obstetrics & Gynecology

## 2018-05-15 DIAGNOSIS — O3680X0 Pregnancy with inconclusive fetal viability, not applicable or unspecified: Secondary | ICD-10-CM

## 2018-05-16 ENCOUNTER — Ambulatory Visit (INDEPENDENT_AMBULATORY_CARE_PROVIDER_SITE_OTHER): Payer: Medicaid Other

## 2018-05-16 DIAGNOSIS — O3680X0 Pregnancy with inconclusive fetal viability, not applicable or unspecified: Secondary | ICD-10-CM

## 2018-05-16 NOTE — Progress Notes (Signed)
[place] 7+5 wks,single IUP w/ ys,positive fht 160 bpm,normal right ovary,left ovary not visualized,crl 18.13 mm

## 2018-06-05 ENCOUNTER — Encounter: Payer: Self-pay | Admitting: Advanced Practice Midwife

## 2018-06-05 ENCOUNTER — Ambulatory Visit (INDEPENDENT_AMBULATORY_CARE_PROVIDER_SITE_OTHER): Payer: Medicaid Other | Admitting: Advanced Practice Midwife

## 2018-06-05 ENCOUNTER — Other Ambulatory Visit: Payer: Self-pay

## 2018-06-05 ENCOUNTER — Ambulatory Visit: Payer: Medicaid Other | Admitting: *Deleted

## 2018-06-05 VITALS — BP 100/69 | HR 88 | Wt 163.0 lb

## 2018-06-05 DIAGNOSIS — Z3A10 10 weeks gestation of pregnancy: Secondary | ICD-10-CM | POA: Diagnosis not present

## 2018-06-05 DIAGNOSIS — Z1389 Encounter for screening for other disorder: Secondary | ICD-10-CM

## 2018-06-05 DIAGNOSIS — Z9889 Other specified postprocedural states: Secondary | ICD-10-CM | POA: Insufficient documentation

## 2018-06-05 DIAGNOSIS — Z1379 Encounter for other screening for genetic and chromosomal anomalies: Secondary | ICD-10-CM

## 2018-06-05 DIAGNOSIS — Z3682 Encounter for antenatal screening for nuchal translucency: Secondary | ICD-10-CM

## 2018-06-05 DIAGNOSIS — Z3400 Encounter for supervision of normal first pregnancy, unspecified trimester: Secondary | ICD-10-CM | POA: Insufficient documentation

## 2018-06-05 DIAGNOSIS — Z331 Pregnant state, incidental: Secondary | ICD-10-CM

## 2018-06-05 DIAGNOSIS — Z3401 Encounter for supervision of normal first pregnancy, first trimester: Secondary | ICD-10-CM | POA: Diagnosis not present

## 2018-06-05 DIAGNOSIS — Z1371 Encounter for nonprocreative screening for genetic disease carrier status: Secondary | ICD-10-CM

## 2018-06-05 NOTE — Patient Instructions (Signed)
 First Trimester of Pregnancy The first trimester of pregnancy is from week 1 until the end of week 12 (months 1 through 3). A week after a sperm fertilizes an egg, the egg will implant on the wall of the uterus. This embryo will begin to develop into a baby. Genes from you and your partner are forming the baby. The female genes determine whether the baby is a boy or a girl. At 6-8 weeks, the eyes and face are formed, and the heartbeat can be seen on ultrasound. At the end of 12 weeks, all the baby's organs are formed.  Now that you are pregnant, you will want to do everything you can to have a healthy baby. Two of the most important things are to get good prenatal care and to follow your health care provider's instructions. Prenatal care is all the medical care you receive before the baby's birth. This care will help prevent, find, and treat any problems during the pregnancy and childbirth. BODY CHANGES Your body goes through many changes during pregnancy. The changes vary from woman to woman.   You may gain or lose a couple of pounds at first.  You may feel sick to your stomach (nauseous) and throw up (vomit). If the vomiting is uncontrollable, call your health care provider.  You may tire easily.  You may develop headaches that can be relieved by medicines approved by your health care provider.  You may urinate more often. Painful urination may mean you have a bladder infection.  You may develop heartburn as a result of your pregnancy.  You may develop constipation because certain hormones are causing the muscles that push waste through your intestines to slow down.  You may develop hemorrhoids or swollen, bulging veins (varicose veins).  Your breasts may begin to grow larger and become tender. Your nipples may stick out more, and the tissue that surrounds them (areola) may become darker.  Your gums may bleed and may be sensitive to brushing and flossing.  Dark spots or blotches  (chloasma, mask of pregnancy) may develop on your face. This will likely fade after the baby is born.  Your menstrual periods will stop.  You may have a loss of appetite.  You may develop cravings for certain kinds of food.  You may have changes in your emotions from day to day, such as being excited to be pregnant or being concerned that something may go wrong with the pregnancy and baby.  You may have more vivid and strange dreams.  You may have changes in your hair. These can include thickening of your hair, rapid growth, and changes in texture. Some women also have hair loss during or after pregnancy, or hair that feels dry or thin. Your hair will most likely return to normal after your baby is born. WHAT TO EXPECT AT YOUR PRENATAL VISITS During a routine prenatal visit:  You will be weighed to make sure you and the baby are growing normally.  Your blood pressure will be taken.  Your abdomen will be measured to track your baby's growth.  The fetal heartbeat will be listened to starting around week 10 or 12 of your pregnancy.  Test results from any previous visits will be discussed. Your health care provider may ask you:  How you are feeling.  If you are feeling the baby move.  If you have had any abnormal symptoms, such as leaking fluid, bleeding, severe headaches, or abdominal cramping.  If you have any questions. Other   tests that may be performed during your first trimester include:  Blood tests to find your blood type and to check for the presence of any previous infections. They will also be used to check for low iron levels (anemia) and Rh antibodies. Later in the pregnancy, blood tests for diabetes will be done along with other tests if problems develop.  Urine tests to check for infections, diabetes, or protein in the urine.  An ultrasound to confirm the proper growth and development of the baby.  An amniocentesis to check for possible genetic problems.  Fetal  screens for spina bifida and Down syndrome.  You may need other tests to make sure you and the baby are doing well. HOME CARE INSTRUCTIONS  Medicines  Follow your health care provider's instructions regarding medicine use. Specific medicines may be either safe or unsafe to take during pregnancy.  Take your prenatal vitamins as directed.  If you develop constipation, try taking a stool softener if your health care provider approves. Diet  Eat regular, well-balanced meals. Choose a variety of foods, such as meat or vegetable-based protein, fish, milk and low-fat dairy products, vegetables, fruits, and whole grain breads and cereals. Your health care provider will help you determine the amount of weight gain that is right for you.  Avoid raw meat and uncooked cheese. These carry germs that can cause birth defects in the baby.  Eating four or five small meals rather than three large meals a day may help relieve nausea and vomiting. If you start to feel nauseous, eating a few soda crackers can be helpful. Drinking liquids between meals instead of during meals also seems to help nausea and vomiting.  If you develop constipation, eat more high-fiber foods, such as fresh vegetables or fruit and whole grains. Drink enough fluids to keep your urine clear or pale yellow. Activity and Exercise  Exercise only as directed by your health care provider. Exercising will help you:  Control your weight.  Stay in shape.  Be prepared for labor and delivery.  Experiencing pain or cramping in the lower abdomen or low back is a good sign that you should stop exercising. Check with your health care provider before continuing normal exercises.  Try to avoid standing for long periods of time. Move your legs often if you must stand in one place for a long time.  Avoid heavy lifting.  Wear low-heeled shoes, and practice good posture.  You may continue to have sex unless your health care provider directs you  otherwise. Relief of Pain or Discomfort  Wear a good support bra for breast tenderness.   Take warm sitz baths to soothe any pain or discomfort caused by hemorrhoids. Use hemorrhoid cream if your health care provider approves.   Rest with your legs elevated if you have leg cramps or low back pain.  If you develop varicose veins in your legs, wear support hose. Elevate your feet for 15 minutes, 3-4 times a day. Limit salt in your diet. Prenatal Care  Schedule your prenatal visits by the twelfth week of pregnancy. They are usually scheduled monthly at first, then more often in the last 2 months before delivery.  Write down your questions. Take them to your prenatal visits.  Keep all your prenatal visits as directed by your health care provider. Safety  Wear your seat belt at all times when driving.  Make a list of emergency phone numbers, including numbers for family, friends, the hospital, and police and fire departments. General   Tips  Ask your health care provider for a referral to a local prenatal education class. Begin classes no later than at the beginning of month 6 of your pregnancy.  Ask for help if you have counseling or nutritional needs during pregnancy. Your health care provider can offer advice or refer you to specialists for help with various needs.  Do not use hot tubs, steam rooms, or saunas.  Do not douche or use tampons or scented sanitary pads.  Do not cross your legs for long periods of time.  Avoid cat litter boxes and soil used by cats. These carry germs that can cause birth defects in the baby and possibly loss of the fetus by miscarriage or stillbirth.  Avoid all smoking, herbs, alcohol, and medicines not prescribed by your health care provider. Chemicals in these affect the formation and growth of the baby.  Schedule a dentist appointment. At home, brush your teeth with a soft toothbrush and be gentle when you floss. SEEK MEDICAL CARE IF:   You have  dizziness.  You have mild pelvic cramps, pelvic pressure, or nagging pain in the abdominal area.  You have persistent nausea, vomiting, or diarrhea.  You have a bad smelling vaginal discharge.  You have pain with urination.  You notice increased swelling in your face, hands, legs, or ankles. SEEK IMMEDIATE MEDICAL CARE IF:   You have a fever.  You are leaking fluid from your vagina.  You have spotting or bleeding from your vagina.  You have severe abdominal cramping or pain.  You have rapid weight gain or loss.  You vomit blood or material that looks like coffee grounds.  You are exposed to German measles and have never had them.  You are exposed to fifth disease or chickenpox.  You develop a severe headache.  You have shortness of breath.  You have any kind of trauma, such as from a fall or a car accident. Document Released: 02/28/2001 Document Revised: 07/21/2013 Document Reviewed: 01/14/2013 [employer] Patient Information 2015 [employer]. This information is not intended to replace advice given to you by your health care provider. Make sure you discuss any questions you have with your health care provider.   Nausea & Vomiting  Have saltine crackers or pretzels by your bed and eat a few bites before you raise your head out of bed in the morning  Eat small frequent meals throughout the day instead of large meals  Drink plenty of fluids throughout the day to stay hydrated, just don't drink a lot of fluids with your meals.  This can make your stomach fill up faster making you feel sick  Do not brush your teeth right after you eat  Products with real ginger are good for nausea, like ginger ale and ginger hard candy Make sure it says made with real ginger!  Sucking on sour candy like lemon heads is also good for nausea  If your prenatal vitamins make you nauseated, take them at night so you will sleep through the nausea  Sea Bands  If you feel like you need  medicine for the nausea & vomiting please let us know  If you are unable to keep any fluids or food down please let us know   Constipation  Drink plenty of fluid, preferably water, throughout the day  Eat foods high in fiber such as fruits, vegetables, and grains  Exercise, such as walking, is a good way to keep your bowels regular  Drink warm fluids, especially warm   prune juice, or decaf coffee  Eat a 1/2 cup of real oatmeal (not instant), 1/2 cup applesauce, and 1/2-1 cup warm prune juice every day  If needed, you may take Colace (docusate sodium) stool softener once or twice a day to help keep the stool soft. If you are pregnant, wait until you are out of your first trimester (12-14 weeks of pregnancy)  If you still are having problems with constipation, you may take Miralax once daily as needed to help keep your bowels regular.  If you are pregnant, wait until you are out of your first trimester (12-14 weeks of pregnancy)  Safe Medications in Pregnancy   Acne: Benzoyl Peroxide Salicylic Acid  Backache/Headache: Tylenol: 2 regular strength every 4 hours OR              2 Extra strength every 6 hours  Colds/Coughs/Allergies: Benadryl (alcohol free) 25 mg every 6 hours as needed Breath right strips Claritin Cepacol throat lozenges Chloraseptic throat spray Cold-Eeze- up to three times per day Cough drops, alcohol free Flonase (by prescription only) Guaifenesin Mucinex Robitussin DM (plain only, alcohol free) Saline nasal spray/drops Sudafed (pseudoephedrine) & Actifed ** use only after [redacted] weeks gestation and if you do not have high blood pressure Tylenol Vicks Vaporub Zinc lozenges Zyrtec   Constipation: Colace Ducolax suppositories Fleet enema Glycerin suppositories Metamucil Milk of magnesia Miralax Senokot Smooth move tea  Diarrhea: Kaopectate Imodium A-D  *NO pepto Bismol  Hemorrhoids: Anusol Anusol HC Preparation  H Tucks  Indigestion: Tums Maalox Mylanta Zantac  Pepcid  Insomnia: Benadryl (alcohol free) 25mg every 6 hours as needed Tylenol PM Unisom, no Gelcaps  Leg Cramps: Tums MagGel  Nausea/Vomiting:  Bonine Dramamine Emetrol Ginger extract Sea bands Meclizine  Nausea medication to take during pregnancy:  Unisom (doxylamine succinate 25 mg tablets) Take one tablet daily at bedtime. If symptoms are not adequately controlled, the dose can be increased to a maximum recommended dose of two tablets daily (1/2 tablet in the morning, 1/2 tablet mid-afternoon and one at bedtime). Vitamin B6 100mg tablets. Take one tablet twice a day (up to 200 mg per day).  Skin Rashes: Aveeno products Benadryl cream or 25mg every 6 hours as needed Calamine Lotion 1% cortisone cream  Yeast infection: Gyne-lotrimin 7 Monistat 7   **If taking multiple medications, please check labels to avoid duplicating the same active ingredients **take medication as directed on the label ** Do not exceed 4000 mg of tylenol in 24 hours **Do not take medications that contain aspirin or ibuprofen      

## 2018-06-05 NOTE — Progress Notes (Signed)
INITIAL OBSTETRICAL VISIT Patient name: Lori Barnes MRN [id number]  Date of birth: [birth year] Chief Complaint:   Initial Prenatal Visit  History of Present Illness:   Lori Barnes is a 28 y.o. G1P0  female at [redacted]w[redacted]d by [place] with an Estimated Date of Delivery: 12/28/18 being seen today for her initial obstetrical visit.   Her obstetrical history is significant for first baby.   Today she reports no complaints. Feeling much less nauseated Patient's last menstrual period was 03/23/2018 (approximate). Last pap 2018. Results were: normal Review of Systems:   Pertinent items are noted in HPI Denies cramping/contractions, leakage of fluid, vaginal bleeding, abnormal vaginal discharge w/ itching/odor/irritation, headaches, visual changes, shortness of breath, chest pain, abdominal pain, severe nausea/vomiting, or problems with urination or bowel movements unless otherwise stated above.  Pertinent History Reviewed:  Reviewed past medical,surgical, social, obstetrical and family history.  Reviewed problem list, medications and allergies. OB History  Gravida Para Term Preterm AB Living  1            SAB TAB Ectopic Multiple Live Births               # Outcome Date GA Lbr Len/2nd Weight Sex Delivery Anes PTL Lv  1 Current            Physical Assessment:   Vitals:   06/05/18 1354  BP: 100/69  Pulse: 88  Weight: 163 lb (73.9 kg)  Body mass index is 29.81 kg/m.       Physical Examination:  General appearance - well appearing, and in no distress  Mental status - alert, oriented to person, place, and time  Psych:  She has a normal mood and affect  Skin - warm and dry, normal color, no suspicious lesions noted  Chest - effort normal, all lung fields clear to auscultation bilaterally  Heart - normal rate and regular rhythm  Abdomen - soft, nontender  Extremities:  No swelling or varicosities noted    via [place]  No results found for this or any previous visit (from the past 24 hour(s)).   Assessment & Plan:  1) Low-Risk Pregnancy G1P0 at [redacted]w[redacted]d with an Estimated Date of Delivery: 12/28/18   2) Initial OB visit    Meds: No orders of the defined types were placed in this encounter.   Initial labs obtained Continue prenatal vitamins Reviewed n/v relief measures and warning s/s to report Reviewed recommended weight gain based on pre-gravid BMI Encouraged well-balanced diet Watched video for carrier screening/genetic testing:  Genetic Screening discussed First Screen and Integrated Screen: requested Cystic fibrosis screening requested SMA screening requested Fragile X screening requested Ultrasound discussed; fetal survey: requested CCNC completed  Follow-up: No follow-ups on file.   Orders Placed This Encounter  Procedures  . GC/Chlamydia Probe Amp  . Urine Culture  . Obstetric Panel, Including HIV  . Urinalysis, Routine w reflex microscopic  . Sickle cell screen  . Pain Management Screening Profile (10S)  . Inheritest Core(CF97,SMA,FraX)  . MaterniT 21 plus Core, Blood  . POC Urinalysis Dipstick OB    Jacklyn Shell DNP, CNM 06/05/2018 2:14 PM

## 2018-06-05 NOTE — Addendum Note (Signed)
Addended by: Moss Mc on: 06/05/2018 03:59 PM   Modules accepted: Orders

## 2018-06-06 ENCOUNTER — Telehealth: Payer: Self-pay | Admitting: *Deleted

## 2018-06-06 NOTE — Telephone Encounter (Signed)
Please call pt she gave you the wrong email address when setting up baby scripts/ and has a question about taking something over the counter

## 2018-06-06 NOTE — Telephone Encounter (Signed)
LMOVM returning patient's call.  

## 2018-06-07 LAB — OBSTETRIC PANEL, INCLUDING HIV
Antibody Screen: NEGATIVE
Basophils Absolute: 0 10*3/uL (ref 0.0–0.2)
Basos: 1 %
EOS (ABSOLUTE): 0.2 10*3/uL (ref 0.0–0.4)
Eos: 2 %
HEMATOCRIT: 36.1 % (ref 34.0–46.6)
HIV Screen 4th Generation wRfx: NONREACTIVE
Hemoglobin: 12.7 g/dL (ref 11.1–15.9)
Hepatitis B Surface Ag: NEGATIVE
Immature Grans (Abs): 0 10*3/uL (ref 0.0–0.1)
Immature Granulocytes: 0 %
Lymphocytes Absolute: 2.3 10*3/uL (ref 0.7–3.1)
Lymphs: 30 %
MCH: 31.5 pg (ref 26.6–33.0)
MCHC: 35.2 g/dL (ref 31.5–35.7)
MCV: 90 fL (ref 79–97)
Monocytes Absolute: 0.5 10*3/uL (ref 0.1–0.9)
Monocytes: 7 %
Neutrophils Absolute: 4.6 10*3/uL (ref 1.4–7.0)
Neutrophils: 60 %
Platelets: 207 10*3/uL (ref 150–450)
RBC: 4.03 x10E6/uL (ref 3.77–5.28)
RDW: 12.7 % (ref 11.7–15.4)
RPR Ser Ql: NONREACTIVE
Rh Factor: POSITIVE
Rubella Antibodies, IGG: 1.15 index (ref >0.99)
WBC: 7.7 10*3/uL (ref 3.4–10.8)

## 2018-06-07 LAB — SICKLE CELL SCREEN: Sickle Cell Screen: NEGATIVE

## 2018-06-07 LAB — URINALYSIS, ROUTINE W REFLEX MICROSCOPIC

## 2018-06-10 ENCOUNTER — Telehealth: Payer: Self-pay | Admitting: *Deleted

## 2018-06-10 NOTE — Telephone Encounter (Signed)
LMOVM returning patient's call.  

## 2018-06-15 LAB — MATERNIT 21 PLUS CORE, BLOOD
Fetal Fraction: 8
RESULT (T21): NEGATIVE
Trisomy 13 (Patau syndrome): NEGATIVE
Trisomy 18 (Edwards syndrome): NEGATIVE
Trisomy 21 (Down syndrome): NEGATIVE

## 2018-06-15 LAB — INHERITEST CORE(CF97,SMA,FRAX)

## 2018-06-24 ENCOUNTER — Encounter: Payer: Medicaid Other | Admitting: Women's Health

## 2018-06-24 ENCOUNTER — Other Ambulatory Visit: Payer: Medicaid Other

## 2018-06-25 ENCOUNTER — Telehealth: Payer: Self-pay | Admitting: *Deleted

## 2018-06-25 NOTE — Telephone Encounter (Signed)
LMOVM that we are not allowing visitors or children to come to appointments at this time. Advised to call if has had any contact with anyone suspected or confirmed of having COVID-19, or if has fever, cough, sob, muscle pain, diarrhea, rash, vomiting, abdominal pain, red eye, weakness, bruising or bleeding, joint pain or severe headache.

## 2018-06-26 ENCOUNTER — Other Ambulatory Visit: Payer: Self-pay

## 2018-06-26 ENCOUNTER — Other Ambulatory Visit (HOSPITAL_COMMUNITY)
Admission: RE | Admit: 2018-06-26 | Discharge: 2018-06-26 | Disposition: A | Discharge status: Home / Self Care | Payer: Medicaid Other | Source: Ambulatory Visit | Attending: Advanced Practice Midwife | Admitting: Advanced Practice Midwife

## 2018-06-26 ENCOUNTER — Ambulatory Visit (INDEPENDENT_AMBULATORY_CARE_PROVIDER_SITE_OTHER): Payer: Medicaid Other | Admitting: Advanced Practice Midwife

## 2018-06-26 ENCOUNTER — Encounter: Payer: Self-pay | Admitting: Advanced Practice Midwife

## 2018-06-26 ENCOUNTER — Ambulatory Visit (INDEPENDENT_AMBULATORY_CARE_PROVIDER_SITE_OTHER): Payer: Medicaid Other

## 2018-06-26 VITALS — BP 99/65 | HR 90 | Temp 97.9°F | Wt 162.0 lb

## 2018-06-26 DIAGNOSIS — Z3682 Encounter for antenatal screening for nuchal translucency: Secondary | ICD-10-CM | POA: Diagnosis not present

## 2018-06-26 DIAGNOSIS — Z3401 Encounter for supervision of normal first pregnancy, first trimester: Secondary | ICD-10-CM

## 2018-06-26 DIAGNOSIS — Z3A13 13 weeks gestation of pregnancy: Secondary | ICD-10-CM

## 2018-06-26 DIAGNOSIS — Z1389 Encounter for screening for other disorder: Secondary | ICD-10-CM

## 2018-06-26 DIAGNOSIS — Z1379 Encounter for other screening for genetic and chromosomal anomalies: Secondary | ICD-10-CM | POA: Diagnosis not present

## 2018-06-26 DIAGNOSIS — Z3402 Encounter for supervision of normal first pregnancy, second trimester: Secondary | ICD-10-CM

## 2018-06-26 DIAGNOSIS — Z331 Pregnant state, incidental: Secondary | ICD-10-CM

## 2018-06-26 LAB — POCT URINALYSIS DIPSTICK OB
Glucose, UA: NEGATIVE
Ketones, UA: NEGATIVE
Leukocytes, UA: NEGATIVE
Nitrite, UA: NEGATIVE
POC,PROTEIN,UA: NEGATIVE

## 2018-06-26 NOTE — Progress Notes (Signed)
[place] 13+4 wks,measurements c/w dates,fhr 145 bpm,anterior placenta gr 0,normal ovaries bilat,NB present,NT 2.5 mm,crl 83.54 mm

## 2018-06-26 NOTE — Patient Instructions (Signed)
Lori Barnes, I greatly value your feedback.  If you receive a survey following your visit with [place] today, we appreciate you taking the time to fill it out.  Thanks, Cathie Beams, CNM     Second Trimester of Pregnancy The second trimester is from week 14 through week 27 (months 4 through 6). The second trimester is often a time when you feel your best. Your body has adjusted to being pregnant, and you begin to feel better physically. Usually, morning sickness has lessened or quit completely, you may have more energy, and you may have an increase in appetite. The second trimester is also a time when the fetus is growing rapidly. At the end of the sixth month, the fetus is about 9 inches long and weighs about 1 pounds. You will likely begin to feel the baby move (quickening) between 16 and 20 weeks of pregnancy. Body changes during your second trimester Your body continues to go through many changes during your second trimester. The changes vary from woman to woman.  Your weight will continue to increase. You will notice your lower abdomen bulging out.  You may begin to get stretch marks on your hips, abdomen, and breasts.  You may develop headaches that can be relieved by medicines. The medicines should be approved by your health care provider.  You may urinate more often because the fetus is pressing on your bladder.  You may develop or continue to have heartburn as a result of your pregnancy.  You may develop constipation because certain hormones are causing the muscles that push waste through your intestines to slow down.  You may develop hemorrhoids or swollen, bulging veins (varicose veins).  You may have back pain. This is caused by: ? Weight gain. ? Pregnancy hormones that are relaxing the joints in your pelvis. ? A shift in weight and the muscles that support your balance.  Your breasts will continue to grow and they will continue to become tender.  Your gums may  bleed and may be sensitive to brushing and flossing.  Dark spots or blotches (chloasma, mask of pregnancy) may develop on your face. This will likely fade after the baby is born.  A dark line from your belly button to the pubic area (linea nigra) may appear. This will likely fade after the baby is born.  You may have changes in your hair. These can include thickening of your hair, rapid growth, and changes in texture. Some women also have hair loss during or after pregnancy, or hair that feels dry or thin. Your hair will most likely return to normal after your baby is born.  What to expect at prenatal visits During a routine prenatal visit:  You will be weighed to make sure you and the fetus are growing normally.  Your blood pressure will be taken.  Your abdomen will be measured to track your baby's growth.  The fetal heartbeat will be listened to.  Any test results from the previous visit will be discussed.  Your health care provider may ask you:  How you are feeling.  If you are feeling the baby move.  If you have had any abnormal symptoms, such as leaking fluid, bleeding, severe headaches, or abdominal cramping.  If you are using any tobacco products, including cigarettes, chewing tobacco, and electronic cigarettes.  If you have any questions.  Other tests that may be performed during your second trimester include:  Blood tests that check for: ? Low iron levels (anemia). ?  High blood sugar that affects pregnant women (gestational diabetes) between 73 and 28 weeks. ? Rh antibodies. This is to check for a protein on red blood cells (Rh factor).  Urine tests to check for infections, diabetes, or protein in the urine.  An ultrasound to confirm the proper growth and development of the baby.  An amniocentesis to check for possible genetic problems.  Fetal screens for spina bifida and Down syndrome.  HIV (human immunodeficiency virus) testing. Routine prenatal testing  includes screening for HIV, unless you choose not to have this test.  Follow these instructions at home: Medicines  Follow your health care provider's instructions regarding medicine use. Specific medicines may be either safe or unsafe to take during pregnancy.  Take a prenatal vitamin that contains at least 600 micrograms (mcg) of folic acid.  If you develop constipation, try taking a stool softener if your health care provider approves. Eating and drinking  Eat a balanced diet that includes fresh fruits and vegetables, whole grains, good sources of protein such as meat, eggs, or tofu, and low-fat dairy. Your health care provider will help you determine the amount of weight gain that is right for you.  Avoid raw meat and uncooked cheese. These carry germs that can cause birth defects in the baby.  If you have low calcium intake from food, talk to your health care provider about whether you should take a daily calcium supplement.  Limit foods that are high in fat and processed sugars, such as fried and sweet foods.  To prevent constipation: ? Drink enough fluid to keep your urine clear or pale yellow. ? Eat foods that are high in fiber, such as fresh fruits and vegetables, whole grains, and beans. Activity  Exercise only as directed by your health care provider. Most women can continue their usual exercise routine during pregnancy. Try to exercise for 30 minutes at least 5 days a week. Stop exercising if you experience uterine contractions.  Avoid heavy lifting, wear low heel shoes, and practice good posture.  A sexual relationship may be continued unless your health care provider directs you otherwise. Relieving pain and discomfort  Wear a good support bra to prevent discomfort from breast tenderness.  Take warm sitz baths to soothe any pain or discomfort caused by hemorrhoids. Use hemorrhoid cream if your health care provider approves.  Rest with your legs elevated if you have  leg cramps or low back pain.  If you develop varicose veins, wear support hose. Elevate your feet for 15 minutes, 3-4 times a day. Limit salt in your diet. Prenatal Care  Write down your questions. Take them to your prenatal visits.  Keep all your prenatal visits as told by your health care provider. This is important. Safety  Wear your seat belt at all times when driving.  Make a list of emergency phone numbers, including numbers for family, friends, the hospital, and police and fire departments. General instructions  Ask your health care provider for a referral to a local prenatal education class. Begin classes no later than the beginning of month 6 of your pregnancy.  Ask for help if you have counseling or nutritional needs during pregnancy. Your health care provider can offer advice or refer you to specialists for help with various needs.  Do not use hot tubs, steam rooms, or saunas.  Do not douche or use tampons or scented sanitary pads.  Do not cross your legs for long periods of time.  Avoid cat litter boxes and  soil used by cats. These carry germs that can cause birth defects in the baby and possibly loss of the fetus by miscarriage or stillbirth.  Avoid all smoking, herbs, alcohol, and unprescribed drugs. Chemicals in these products can affect the formation and growth of the baby.  Do not use any products that contain nicotine or tobacco, such as cigarettes and e-cigarettes. If you need help quitting, ask your health care provider.  Visit your dentist if you have not gone yet during your pregnancy. Use a soft toothbrush to brush your teeth and be gentle when you floss. Contact a health care provider if:  You have dizziness.  You have mild pelvic cramps, pelvic pressure, or nagging pain in the abdominal area.  You have persistent nausea, vomiting, or diarrhea.  You have a bad smelling vaginal discharge.  You have pain when you urinate. Get help right away if:  You  have a fever.  You are leaking fluid from your vagina.  You have spotting or bleeding from your vagina.  You have severe abdominal cramping or pain.  You have rapid weight gain or weight loss.  You have shortness of breath with chest pain.  You notice sudden or extreme swelling of your face, hands, ankles, feet, or legs.  You have not felt your baby move in over an hour.  You have severe headaches that do not go away when you take medicine.  You have vision changes. Summary  The second trimester is from week 14 through week 27 (months 4 through 6). It is also a time when the fetus is growing rapidly.  Your body goes through many changes during pregnancy. The changes vary from woman to woman.  Avoid all smoking, herbs, alcohol, and unprescribed drugs. These chemicals affect the formation and growth your baby.  Do not use any tobacco products, such as cigarettes, chewing tobacco, and e-cigarettes. If you need help quitting, ask your health care provider.  Contact your health care provider if you have any questions. Keep all prenatal visits as told by your health care provider. This is important. This information is not intended to replace advice given to you by your health care provider. Make sure you discuss any questions you have with your health care provider.      CHILDBIRTH CLASSES [phone] is the phone number for Pregnancy Classes or hospital tours at [facility].   You will be referred to  [email or web address] for more information on childbirth classes  At this site you may register for classes. You may sign up for a waiting list if classes are full. Please SIGN UP FOR THIS!.   When the waiting list becomes long, sometimes new classes can be added.   trimes

## 2018-06-26 NOTE — Progress Notes (Signed)
LOW-RISK PREGNANCY VISIT Patient name: Lori Barnes MRN [id number]  Date of birth: 11/30/1990 Chief Complaint:   Routine Prenatal Visit (NT/IT- pap)  History of Present Illness:   Lori Barnes is a 28 y.o. G1P0 female at 5620w4d with an Estimated Date of Delivery: 12/28/18 being seen today for ongoing management of a low-risk pregnancy.  Today she reports no complaints.  .  .   . denies leaking of fluid. Review of Systems:   Pertinent items are noted in HPI Denies abnormal vaginal discharge w/ itching/odor/irritation, headaches, visual changes, shortness of breath, chest pain, abdominal pain, severe nausea/vomiting, or problems with urination or bowel movements unless otherwise stated above.  Pertinent History Reviewed:  Medical & Surgical Hx:   Past Medical History:  Diagnosis Date  . Vaginal Pap smear, abnormal    Past Surgical History:  Procedure Laterality Date  . LEEP    . WISDOM TOOTH EXTRACTION     Family History  Problem Relation Age of Onset  . Hodgkin's lymphoma Mother   . Cancer Father   . Hypertension Maternal Grandmother   . Cancer Paternal Grandfather        oral    Current Outpatient Medications:  .  NON FORMULARY, flinstone vitamine, Disp: , Rfl:  .  ondansetron (ZOFRAN) 4 MG tablet, Take 1 tablet (4 mg total) by mouth every 8 (eight) hours as needed for nausea or vomiting. (Patient not taking: Reported on 06/05/2018), Disp: 8 tablet, Rfl: 0 .  Prenatal Vit-Fe Fumarate-FA (PRENATAL MULTIVITAMIN) TABS tablet, Take 1 tablet by mouth daily at 12 noon., Disp: , Rfl:  Social History: Reviewed -  reports that she has been smoking cigarettes. She has a 9.00 pack-year smoking history. She has never used smokeless tobacco.  Physical Assessment:   Vitals:   06/26/18 1149  BP: 99/65  Pulse: 90  Temp: 97.9 F (36.6 C)  Weight: 162 lb (73.5 kg)  Body mass index is 29.63 kg/m.        Physical Examination:   General appearance: Well appearing, and in no  distress  Mental status: Alert, oriented to person, place, and time  Skin: Warm & dry  Cardiovascular: Normal heart rate noted  Respiratory: Normal respiratory effort, no distress  Abdomen: Soft, gravid, nontender  Pelvic: normal appearing, pap collected         Extremities: Edema: None  Fetal Status: Fetal Heart Rate (bpm): 145       US 13+4 wks,measurements c/w dates,fhr 145 bpm,anterior placenta gr 0,normal ovaries bilat,NB present,NT 2.5 mm,crl 83.54 mm   Results for orders placed or performed in visit on 06/26/18 (from the past 24 hour(s))  POC Urinalysis Dipstick OB   Collection Time: 06/26/18 11:59 AM  Result Value Ref Range   Color, UA     Clarity, UA     Glucose, UA Negative Negative   Bilirubin, UA     Ketones, UA neg    Spec Grav, UA     Blood, UA trace    pH, UA     POC,PROTEIN,UA Negative Negative, Trace, Small (1+), Moderate (2+), Large (3+), 4+   Urobilinogen, UA     Nitrite, UA neg    Leukocytes, UA Negative Negative   Appearance     Odor      Assessment & Plan:  1) Low-risk pregnancy G1P0 at 7020w4d with an Estimated Date of Delivery: 12/28/18     Labs/procedures/US today: Thin Prep  Plan:  Continue routine obstetrical care Has BP cuff  Follow-up: Return in about 4 weeks (around 07/24/2018) for virtual LROB visit.  Orders Placed This Encounter  Procedures  . Integrated 1  . POC Urinalysis Dipstick OB   Jacklyn Shell CNM 06/26/2018 4:08 PM

## 2018-06-27 LAB — CYTOLOGY - PAP
Chlamydia: NEGATIVE
Diagnosis: NEGATIVE
Neisseria Gonorrhea: NEGATIVE

## 2018-06-28 LAB — INTEGRATED 1
Crown Rump Length: 83.5 mm
Gest. Age on Collection Date: 13.9 wk
Maternal Age at EDD: 28.8 a
Nuchal Translucency (NT): 2.5 mm
Number of Fetuses: 1
PAPP-A Value: 1059.9 ng/mL
Weight: 162 [lb_av]

## 2018-07-04 ENCOUNTER — Telehealth: Payer: Self-pay | Admitting: Women's Health

## 2018-07-04 NOTE — Telephone Encounter (Signed)
Patient called stating that her Dental office is closed due to the Virus and her thinks one of her tooth is infected and would like for our doctors to call her in a antibiotic. Please contact pt

## 2018-07-05 ENCOUNTER — Other Ambulatory Visit: Payer: Self-pay | Admitting: Women's Health

## 2018-07-05 MED ORDER — PENICILLIN V POTASSIUM 500 MG PO TABS: 500.0000 mg | ORAL_TABLET | Freq: Four times a day (QID) | ORAL | 0 refills | Status: DC

## 2018-07-23 ENCOUNTER — Encounter: Payer: Self-pay | Admitting: *Deleted

## 2018-07-24 ENCOUNTER — Ambulatory Visit (INDEPENDENT_AMBULATORY_CARE_PROVIDER_SITE_OTHER): Payer: Medicaid Other | Admitting: Obstetrics and Gynecology

## 2018-07-24 ENCOUNTER — Encounter: Payer: Self-pay | Admitting: Obstetrics and Gynecology

## 2018-07-24 ENCOUNTER — Other Ambulatory Visit: Payer: Self-pay

## 2018-07-24 DIAGNOSIS — Z3402 Encounter for supervision of normal first pregnancy, second trimester: Secondary | ICD-10-CM

## 2018-07-24 DIAGNOSIS — Z3A17 17 weeks gestation of pregnancy: Secondary | ICD-10-CM

## 2018-07-24 MED ORDER — ONDANSETRON HCL 4 MG PO TABS: 4.0000 mg | ORAL_TABLET | Freq: Three times a day (TID) | ORAL | 2 refills | Status: DC | PRN

## 2018-07-24 MED ORDER — DOCUSATE SODIUM 100 MG PO CAPS: 100.0000 mg | ORAL_CAPSULE | Freq: Two times a day (BID) | ORAL | 2 refills | Status: DC | PRN

## 2018-07-24 NOTE — Progress Notes (Signed)
   TELEHEALTH VIRTUAL OBSTETRICS PRENATAL VISIT ENCOUNTER NOTE  I connected with Lori Barnes on 07/24/18 at  1:45 PM EDT by WebEx at home and verified that I am speaking with the correct person using two identifiers.   I discussed the limitations, risks, security and privacy concerns of performing an evaluation and management service by telephone and the availability of in person appointments. I also discussed with the patient that there may be a patient responsible charge related to this service. The patient expressed understanding and agreed to proceed. Subjective:  Lori Barnes is a 28 y.o. G1P0 at [redacted]w[redacted]d being seen today for ongoing prenatal care.  She is currently monitored for the following issues for this low-risk pregnancy and has Supervision of normal first pregnancy and S/P LEEP on their problem list.  Patient reports occ N/V and constipation.  Reports fetal movement.  . Vag. Bleeding: None.  Movement: Present. Denies any contractions, bleeding or leaking of fluid.   The following portions of the patient's history were reviewed and updated as appropriate: allergies, current medications, past family history, past medical history, past social history, past surgical history and problem list.   Objective:  There were no vitals filed for this visit.  Fetal Status:     Movement: Present     General:  Alert, oriented and cooperative. Patient is in no acute distress.  Respiratory: Normal respiratory effort, no problems with respiration noted  Mental Status: Normal mood and affect. Normal behavior. Normal judgment and thought content.  Rest of physical exam deferred due to type of encounter  Assessment and Plan:  Pregnancy: G1P0 at [redacted]w[redacted]d 1. Encounter for supervision of normal first pregnancy in second trimester Stable BP cuff ordered BP monitoring in pregnancy reviewed with pt. Anatomy scan next visit - ondansetron (ZOFRAN) 4 MG tablet; Take 1 tablet (4 mg total) by mouth every 8  (eight) hours as needed for nausea or vomiting.  Dispense: 8 tablet; Refill: 2 - docusate sodium (COLACE) 100 MG capsule; Take 1 capsule (100 mg total) by mouth 2 (two) times daily as needed.  Dispense: 30 capsule; Refill: 2  Preterm labor symptoms and general obstetric precautions including but not limited to vaginal bleeding, contractions, leaking of fluid and fetal movement were reviewed in detail with the patient. I discussed the assessment and treatment plan with the patient. The patient was provided an opportunity to ask questions and all were answered. The patient agreed with the plan and demonstrated an understanding of the instructions. The patient was advised to call back or seek an in-person office evaluation/go to [facility] at [facility] for any urgent or concerning symptoms. Please refer to After Visit Summary for other counseling recommendations.   I provided 11 minutes of face-to-face via WebEx time during this encounter.  Return in about 3 weeks (around 08/14/2018) for OB visit, face to face and anatomy U/S.  No future appointments.  Hermina Staggers, MD Center for [facility], [facility]

## 2018-07-29 DIAGNOSIS — Z3490 Encounter for supervision of normal pregnancy, unspecified, unspecified trimester: Secondary | ICD-10-CM | POA: Diagnosis not present

## 2018-08-06 ENCOUNTER — Telehealth: Payer: Self-pay | Admitting: Women's Health

## 2018-08-06 NOTE — Telephone Encounter (Signed)
Pt states that she has a broken tooth that is swelling around it. Her dentist is closed. Pt is wanting to see what she can take for the pain as well as the swelling.

## 2018-08-13 ENCOUNTER — Other Ambulatory Visit: Payer: Self-pay | Admitting: Obstetrics and Gynecology

## 2018-08-13 ENCOUNTER — Encounter: Payer: Self-pay | Admitting: *Deleted

## 2018-08-13 DIAGNOSIS — Z363 Encounter for antenatal screening for malformations: Secondary | ICD-10-CM

## 2018-08-14 ENCOUNTER — Encounter: Payer: Medicaid Other | Admitting: Obstetrics and Gynecology

## 2018-08-14 ENCOUNTER — Other Ambulatory Visit: Payer: Medicaid Other

## 2018-08-27 ENCOUNTER — Encounter: Payer: Self-pay | Admitting: *Deleted

## 2018-08-28 ENCOUNTER — Other Ambulatory Visit: Payer: Self-pay

## 2018-08-28 ENCOUNTER — Ambulatory Visit (INDEPENDENT_AMBULATORY_CARE_PROVIDER_SITE_OTHER): Payer: Medicaid Other | Admitting: Obstetrics and Gynecology

## 2018-08-28 ENCOUNTER — Ambulatory Visit (INDEPENDENT_AMBULATORY_CARE_PROVIDER_SITE_OTHER): Payer: Medicaid Other

## 2018-08-28 VITALS — BP 105/66 | HR 106 | Wt 171.0 lb

## 2018-08-28 DIAGNOSIS — Z3402 Encounter for supervision of normal first pregnancy, second trimester: Secondary | ICD-10-CM

## 2018-08-28 DIAGNOSIS — Z1389 Encounter for screening for other disorder: Secondary | ICD-10-CM

## 2018-08-28 DIAGNOSIS — Z3A22 22 weeks gestation of pregnancy: Secondary | ICD-10-CM

## 2018-08-28 DIAGNOSIS — Z363 Encounter for antenatal screening for malformations: Secondary | ICD-10-CM | POA: Diagnosis not present

## 2018-08-28 DIAGNOSIS — Z331 Pregnant state, incidental: Secondary | ICD-10-CM

## 2018-08-28 LAB — POCT URINALYSIS DIPSTICK OB
Blood, UA: NEGATIVE
Glucose, UA: NEGATIVE
Ketones, UA: NEGATIVE
Leukocytes, UA: NEGATIVE
Nitrite, UA: NEGATIVE
POC,PROTEIN,UA: NEGATIVE

## 2018-08-28 MED ORDER — OMEPRAZOLE 20 MG PO CPDR: 20.0000 mg | DELAYED_RELEASE_CAPSULE | Freq: Every day | ORAL | 3 refills | Status: DC

## 2018-08-28 NOTE — Progress Notes (Signed)
[place] 64+3 wks,cephalic,cx 3 cm,anterior placenta gr 0,normal ovaries bilat,svp of fluid 6.3 cm,fhr 146 bpm,efw 530 g 50%,anatomy complete,no obvious abnormalities

## 2018-08-28 NOTE — Patient Instructions (Signed)
Please check out [email or web address]   for more information on childbirth classes   [facility] Call to Register: [phone] or [phone]   or   Register Online: [email or web address] THESE CLASSES FILL UP VERY QUICKLY, SO SIGN UP AS SOON AS YOU CAN!!! Please visit [facility] pregnancy website at [email or web address]  Childbirth Classes  Option 1: Birth & Baby Series ? Series of 3 weekly classes, on the same day of the week (can choose Mon-Thurs) from 6-9pm ? Helps you and your support person prepare for childbirth ? Reviews newborn care, labor & birth, cesarean birth, pain management, and comfort techniques ? Cost: $60 per couple for insured or self-pay, $30 per couple for Medicaid  Option 2: Weekend Birth & Baby ? This class is a weekend version of our Birth & Baby series.  It is designed for parents who have a difficult time fitting several weeks of classes into their schedule.   ? Covers the care of your newborn and the basics of labor and childbirth ? Friday 6:30pm-8:30pm Saturday 9am-4pm, includes lunch for you and your partner  ? Cost: $75 per couple for insured or self-pay, $30 per couple for Medicaid  Option 3: Natural Childbirth ? This series of 5 weekly classes is for expectant parents who want to learn and practice natural methods of coping with the process of labor and childbirth.  Can choose Mon or Tues, 7-9pm.   ? Covers relaxation, breathing, massage, visualization, role of the partner, and helpful positioning ? Participants learn how to be confident in their body's ability to give birth. Class empowers and helps parents make informed decisions about care. Includes discussion that will help new parents transition into the immediate postpartum period.  ? Cost: $75 per couple for insured or self-pay, $30 per couple for Medicaid  Option 4: Online Birth  & Baby ? This online class offers you the freedom to complete a Birth & Baby series in the comfort of your own home.  The flexibility of this option allows you to review sections at your own pace, at times convenient to you and your support people.  It includes additional video information, animations, quizzes and extended activities. Get organized with helpful eClass tools, checklists, and trackers.  ? Cost: $60 for 60 days of online access                                                                            Other Available Classes  Baby & Me Enjoy this time to discuss newborn & infant parenting topics and family adjustment issues with other new mothers in a relaxed environment. Each week brings a new speaker or baby-centered activity. We encourage mothers and their babies (birth to crawling) to join [place]. You are welcome to visit this group even if you haven't delivered yet! It's wonderful to make new friends early and watch other moms interact with their babies. No registration or fee.  Big Brother/Big Sister Let your children share in the joy of a new brother or sister in this special class designed just for them. Discussion includes how families care for babies: swaddling, holding, diapering, safety, as well as how they can be helpful in their new role.  This class is designed for children ages 51 to 42, but any age is welcome. Please register each child individually. $5 Breastfeeding Support Group This group is a mother-to-mother support circle where moms have the opportunity to share their breastfeeding experiences. A Breastfeeding Support nurse is present for questions and concerns. An infant scale is available for weight checks. No fee or registration.  Breastfeeding Your Baby Breastfeeding is a special time for mother and child. This class will help you feel ready to begin this important relationship. Your partner is encouraged to attend with you. Learn what to expect and feel more confident  in the first days of breastfeeding your newborn. This class also addresses the most common fears and challenges of breastfeeding during the first few weeks, months, and beyond. $30 per couple Caring for Baby This class is for expectant and adoptive parents who want to learn and practice the most up-to-date newborn care for their babies. Focus is on birth through first six weeks of life. Topics include feeding, bathing, diapering, crying, umbilical cord care, circumcision care and safe sleep. Parents learn how to recognize symptoms of illness and when to call the pediatrician. Register only the mom-to-be and your partner can plan to come with you. (*Note: This class is included in the Birth & Baby series and the Weekend Birth & Baby classes.) $10 per couple Comfort Techniques & Tour This 2-hour interactive class is designed for those who either do not wish to take the Birth & Baby series or for those who prefer our online childbirth class, but don't want to miss the opportunity to learn and practice hands-on techniques. These skills can help relieve some of the discomfort of labor and encourage your baby to rotate toward the best position for birth. You and your partner will be able to try a variety of labor positions with birth balls and rebozos as well as practice breathing, relaxation, and visual techniques. $20 per couple [employer] This course offers Dads-to-be the tools and knowledge needed to feel confident on their journey to becoming new fathers. Experienced dads, who have been trained as coaches, teach dads-to-be how to hold, comfort, diapers, swaddle and play with their infant while being able to support the new mom as well. $25 Grandparent Love Expecting a grandbaby? Learn about the latest infant care and safety recommendations and ways to support your own child as he or she transitions into the parenting role. $10 per person Infant and Child CPR Parents, grandparents, babysitters, and  friends learn Cardio-Pulmonary Resuscitation skills for infants and children. You will also learn how to treat both conscious and unconscious choking infants and children. Register each participant individually. (Note: This Family & Friends program does not offer certification.) $06 per person Marvelous Multiples Expecting twins, triplets, or more? This free 2-hour class covers the differences in labor, birth, parenting, and breastfeeding issues that face multiples' parents.  [facility] Talk This free mom-led group offers support and connection to mothers as they journey through the adjustments and struggles of that sometimes overwhelming first year after the birth of a child. A member of our staff will be present to share resources and additional support if needed, as you care for yourself and baby. You are welcome to visit this group before you deliver! It's wonderful to meet new friends early and watch other moms interact with their babies.  Doren Custard  Class °Interested in a waterbirth? This free informational class will help you discover whether waterbirth is the right fit for you and is required if you are planning a waterbirth. Education about waterbirth itself, supplies you may need, and what you may need from your support team is included in this class. Partners are encouraged to come. °  ° °

## 2018-08-28 NOTE — Progress Notes (Signed)
Patient ID: Lori Barnes, female   DOB: 1990/10/30, 28 y.o.   MRN: [id number]    LOW-RISK PREGNANCY VISIT Patient name: Lori Barnes MRN [id number]  Date of birth: 07-18-1990 Chief Complaint:   Routine Prenatal Visit (U/S)  History of Present Illness:   Lori Barnes is a 28 y.o. G1P0 female at [redacted]w[redacted]d with an Estimated Date of Delivery: 12/28/18 being seen today for ongoing management of a low-risk pregnancy. Had LEEP done with Dr. Adah Perl in [place]. Partner has 2 kids of his own, this is Eimy's first and is adamant about child birth reading. Has bad heartburn and gummy tums don't help Today she reports no complaints.  .  .  Movement: Present. denies leaking of fluid. Review of Systems:   Pertinent items are noted in HPI Denies abnormal vaginal discharge w/ itching/odor/irritation, headaches, visual changes, shortness of breath, chest pain, abdominal pain, severe nausea/vomiting, or problems with urination or bowel movements unless otherwise stated above. Pertinent History Reviewed:  Reviewed past medical,surgical, social, obstetrical and family history.  Reviewed problem list, medications and allergies. Physical Assessment:   Vitals:   08/28/18 1553  BP: 105/66  Pulse: (!) 106  Weight: 171 lb (77.6 kg)  Body mass index is 31.28 kg/m.        Physical Examination:   General appearance: Well appearing, and in no distress  Mental status: Alert, oriented to person, place, and time  Skin: Warm & dry  Cardiovascular: Normal heart rate noted  Respiratory: Normal respiratory effort, no distress  Abdomen: Soft, gravid, nontender  Pelvic: Cervical exam deferred         Extremities: Edema: None  Fetal Status:     Movement: Present    Results for orders placed or performed in visit on 08/28/18 (from the past 24 hour(s))  POC Urinalysis Dipstick OB   Collection Time: 08/28/18  3:56 PM  Result Value Ref Range   Color, UA     Clarity, UA     Glucose, UA Negative Negative   Bilirubin, UA     Ketones, UA neg    Spec Grav, UA     Blood, UA neg    pH, UA     POC,PROTEIN,UA Negative Negative, Trace, Small (1+), Moderate (2+), Large (3+), 4+   Urobilinogen, UA     Nitrite, UA neg    Leukocytes, UA Negative Negative   Appearance     Odor      Assessment & Plan:  1) Low-risk pregnancy G1P0 at [redacted]w[redacted]d with an Estimated Date of Delivery: 12/28/18    Meds: No orders of the defined types were placed in this encounter.  Labs/procedures today: [place] 70+3 wks,cephalic,cx 3 cm length s/p leep as teen ,anterior placenta gr 0,normal ovaries bilat,svp of fluid 6.3 cm,fhr 146 bpm,efw 530 g 50%,anatomy complete,no obvious abnormalities   Plan:   1. Continue routine obstetrical care  2. F/u 4 weeks -5 wk for PN2 3. Rx omeprazole  Follow-up: No follow-ups on file.  Orders Placed This Encounter  Procedures  . POC Urinalysis Dipstick OB   By signing my name below, I, Samul Dada, attest that this documentation has been prepared under the direction and in the presence of Jonnie Kind, MD. Electronically Signed: [place]. 08/28/18. 4:01 PM.  I personally performed the services described in this documentation, which was SCRIBED in my presence. The recorded information has been reviewed and considered accurate. It has been edited as necessary during review. Jonnie Kind, MD

## 2018-09-03 ENCOUNTER — Other Ambulatory Visit: Payer: Self-pay

## 2018-09-03 ENCOUNTER — Encounter: Payer: Self-pay | Admitting: Obstetrics & Gynecology

## 2018-09-03 ENCOUNTER — Ambulatory Visit (INDEPENDENT_AMBULATORY_CARE_PROVIDER_SITE_OTHER): Payer: Medicaid Other | Admitting: Obstetrics & Gynecology

## 2018-09-03 VITALS — BP 140/81 | HR 100 | Wt 175.0 lb

## 2018-09-03 DIAGNOSIS — Z3A23 23 weeks gestation of pregnancy: Secondary | ICD-10-CM

## 2018-09-03 DIAGNOSIS — B9689 Other specified bacterial agents as the cause of diseases classified elsewhere: Secondary | ICD-10-CM

## 2018-09-03 DIAGNOSIS — N760 Acute vaginitis: Secondary | ICD-10-CM

## 2018-09-03 DIAGNOSIS — Z3402 Encounter for supervision of normal first pregnancy, second trimester: Secondary | ICD-10-CM

## 2018-09-03 MED ORDER — METRONIDAZOLE 0.75 % VA GEL: VAGINAL | 0 refills | Status: DC

## 2018-09-03 NOTE — Progress Notes (Signed)
   LOW-RISK PREGNANCY VISIT Patient name: Lori Barnes MRN [id number]  Date of birth: 1990-08-15 Chief Complaint:   Routine Prenatal Visit (having spotting and low back pain)  History of Present Illness:   Lori Barnes is a 28 y.o. G1P0 female at [redacted]w[redacted]d with an Estimated Date of Delivery: 12/28/18 being seen today for ongoing management of a low-risk pregnancy.  Today she reports spotting. Contractions: Not present. Vag. Bleeding: Scant.  Movement: Present. denies leaking of fluid. Review of Systems:   Pertinent items are noted in HPI Denies abnormal vaginal discharge w/ itching/odor/irritation, headaches, visual changes, shortness of breath, chest pain, abdominal pain, severe nausea/vomiting, or problems with urination or bowel movements unless otherwise stated above. Pertinent History Reviewed:  Reviewed past medical,surgical, social, obstetrical and family history.  Reviewed problem list, medications and allergies. Physical Assessment:   Vitals:   09/03/18 1115  BP: 140/81  Pulse: 100  Weight: 175 lb (79.4 kg)  Body mass index is 32.01 kg/m.        Physical Examination:   General appearance: Well appearing, and in no distress  Mental status: Alert, oriented to person, place, and time  Skin: Warm & dry  Cardiovascular: Normal heart rate noted  Respiratory: Normal respiratory effort, no distress  Abdomen: Soft, gravid, nontender  Pelvic: SSE +discharge wet pre +clue cells no yeast no trich         Extremities: Edema: None  Fetal Status:     Movement: Present    No results found for this or any previous visit (from the past 24 hour(s)).  Assessment & Plan:  1) Low-risk pregnancy G1P0 at [redacted]w[redacted]d with an Estimated Date of Delivery: 12/28/18   2) BV,    Meds:  Meds ordered this encounter  Medications  . metroNIDAZOLE (METROGEL VAGINAL) 0.75 % vaginal gel    Sig: Nightly x 5 nights    Dispense:  70 g    Refill:  0   Labs/procedures today: wet prep  Plan:  Continue  routine obstetrical care   Reviewed: Preterm labor symptoms and general obstetric precautions including but not limited to vaginal bleeding, contractions, leaking of fluid and fetal movement were reviewed in detail with the patient.  All questions were answered  Follow-up: Return for keep scheduled appt.  No orders of the defined types were placed in this encounter.  Florian Buff  09/03/2018 11:37 AM

## 2018-09-09 ENCOUNTER — Telehealth: Payer: Self-pay | Admitting: *Deleted

## 2018-09-09 ENCOUNTER — Telehealth: Payer: Self-pay | Admitting: Obstetrics & Gynecology

## 2018-09-09 ENCOUNTER — Other Ambulatory Visit: Payer: Self-pay | Admitting: *Deleted

## 2018-09-09 NOTE — Telephone Encounter (Signed)
Pt states that the last few days her right arm has been swelling and tingling. She states that her ankles are also swelling. Requesting to speak with a nurse.

## 2018-09-09 NOTE — Telephone Encounter (Signed)
Patient returning your call.  Advised her to watch her phone for a call.  [phone]

## 2018-09-09 NOTE — Telephone Encounter (Signed)
Patient states she is having numbness and tingling in her right wrist especially at night when she sleeps. Informed patient that this is very common and normal during pregnancy and usually resolves after pregnancy.  Advised to try wearing wrist splints as this may give her some relief.    She also states she is having more swelling than normal in her ankles and feet.  Informed patient since it has been warmer the last couple of days, this may be causing some of her swelling and to push fluids. Advised she could also try wearing compression hose during the day as well.  Verbalized understanding with no further questions.

## 2018-09-09 NOTE — Telephone Encounter (Signed)
LMOVM returning patient's call.  

## 2018-09-16 ENCOUNTER — Telehealth: Payer: Self-pay | Admitting: *Deleted

## 2018-09-16 ENCOUNTER — Other Ambulatory Visit: Payer: Self-pay | Admitting: *Deleted

## 2018-09-16 DIAGNOSIS — Z3402 Encounter for supervision of normal first pregnancy, second trimester: Secondary | ICD-10-CM

## 2018-09-16 MED ORDER — ONDANSETRON HCL 4 MG PO TABS: 4.0000 mg | ORAL_TABLET | Freq: Three times a day (TID) | ORAL | 2 refills | Status: DC | PRN

## 2018-09-16 NOTE — Telephone Encounter (Signed)
Patient called with fever of 100.0 and 101 and sore throat for the past 2 days.  Asked what she can take for this.  Advised to take extra strength to get fever down and to push fluids.  States she also needs a refill on her Zofran.  Will get provider to send in.

## 2018-09-19 DIAGNOSIS — J029 Acute pharyngitis, unspecified: Secondary | ICD-10-CM | POA: Diagnosis not present

## 2018-09-19 DIAGNOSIS — B9689 Other specified bacterial agents as the cause of diseases classified elsewhere: Secondary | ICD-10-CM | POA: Diagnosis not present

## 2018-09-19 DIAGNOSIS — J309 Allergic rhinitis, unspecified: Secondary | ICD-10-CM | POA: Diagnosis not present

## 2018-09-19 DIAGNOSIS — O26892 Other specified pregnancy related conditions, second trimester: Secondary | ICD-10-CM | POA: Diagnosis not present

## 2018-09-19 DIAGNOSIS — J208 Acute bronchitis due to other specified organisms: Secondary | ICD-10-CM | POA: Diagnosis not present

## 2018-09-19 DIAGNOSIS — R0602 Shortness of breath: Secondary | ICD-10-CM | POA: Diagnosis not present

## 2018-09-19 DIAGNOSIS — J209 Acute bronchitis, unspecified: Secondary | ICD-10-CM | POA: Diagnosis not present

## 2018-09-19 DIAGNOSIS — O99512 Diseases of the respiratory system complicating pregnancy, second trimester: Secondary | ICD-10-CM | POA: Diagnosis not present

## 2018-09-19 DIAGNOSIS — R509 Fever, unspecified: Secondary | ICD-10-CM | POA: Diagnosis not present

## 2018-09-19 DIAGNOSIS — R05 Cough: Secondary | ICD-10-CM | POA: Diagnosis not present

## 2018-09-19 DIAGNOSIS — Z3A26 26 weeks gestation of pregnancy: Secondary | ICD-10-CM | POA: Diagnosis not present

## 2018-09-19 DIAGNOSIS — O99332 Smoking (tobacco) complicating pregnancy, second trimester: Secondary | ICD-10-CM | POA: Diagnosis not present

## 2018-09-25 ENCOUNTER — Encounter: Payer: Medicaid Other | Admitting: Women's Health

## 2018-10-03 ENCOUNTER — Encounter: Payer: Self-pay | Admitting: Advanced Practice Midwife

## 2018-10-03 ENCOUNTER — Ambulatory Visit (INDEPENDENT_AMBULATORY_CARE_PROVIDER_SITE_OTHER): Payer: Medicaid Other | Admitting: Advanced Practice Midwife

## 2018-10-03 ENCOUNTER — Other Ambulatory Visit: Payer: Self-pay

## 2018-10-03 ENCOUNTER — Other Ambulatory Visit: Payer: Medicaid Other

## 2018-10-03 VITALS — BP 127/75 | HR 97 | Wt 180.0 lb

## 2018-10-03 DIAGNOSIS — Z1389 Encounter for screening for other disorder: Secondary | ICD-10-CM

## 2018-10-03 DIAGNOSIS — Z3A27 27 weeks gestation of pregnancy: Secondary | ICD-10-CM

## 2018-10-03 DIAGNOSIS — Z3402 Encounter for supervision of normal first pregnancy, second trimester: Secondary | ICD-10-CM

## 2018-10-03 DIAGNOSIS — Z331 Pregnant state, incidental: Secondary | ICD-10-CM

## 2018-10-03 DIAGNOSIS — Z3482 Encounter for supervision of other normal pregnancy, second trimester: Secondary | ICD-10-CM

## 2018-10-03 NOTE — Addendum Note (Signed)
Addended by: Octaviano Glow on: 10/03/2018 05:05 PM   Modules accepted: Orders

## 2018-10-03 NOTE — Progress Notes (Signed)
   LOW-RISK PREGNANCY VISIT Patient name: Lori Barnes MRN [id number]  Date of birth: 1990-03-25 Chief Complaint:   Routine Prenatal Visit (PN2)  History of Present Illness:   Lori Barnes is a 28 y.o. G1P0 female at [redacted]w[redacted]d with an Estimated Date of Delivery: 12/28/18 being seen today for ongoing management of a low-risk pregnancy.  Today she reports no complaints. Contractions: Not present. Vag. Bleeding: None.  Movement: Present. denies leaking of fluid. Review of Systems:   Pertinent items are noted in HPI Denies abnormal vaginal discharge w/ itching/odor/irritation, headaches, visual changes, shortness of breath, chest pain, abdominal pain, severe nausea/vomiting, or problems with urination or bowel movements unless otherwise stated above.  Pertinent History Reviewed:  Medical & Surgical Hx:   Past Medical History:  Diagnosis Date  . Vaginal Pap smear, abnormal    Past Surgical History:  Procedure Laterality Date  . LEEP    . WISDOM TOOTH EXTRACTION     Family History  Problem Relation Age of Onset  . Hodgkin's lymphoma Mother   . Cancer Father   . Hypertension Maternal Grandmother   . Cancer Paternal Grandfather        oral    Current Outpatient Medications:  .  docusate sodium (COLACE) 100 MG capsule, Take 1 capsule (100 mg total) by mouth 2 (two) times daily as needed., Disp: 30 capsule, Rfl: 2 .  omeprazole (PRILOSEC) 20 MG capsule, Take 1 capsule (20 mg total) by mouth daily., Disp: 30 capsule, Rfl: 3 .  ondansetron (ZOFRAN) 4 MG tablet, Take 1 tablet (4 mg total) by mouth every 8 (eight) hours as needed for nausea or vomiting., Disp: 16 tablet, Rfl: 2 .  Prenatal Vit-Fe Fumarate-FA (PRENATAL MULTIVITAMIN) TABS tablet, Take 1 tablet by mouth daily at 12 noon., Disp: , Rfl:  .  metroNIDAZOLE (METROGEL VAGINAL) 0.75 % vaginal gel, Nightly x 5 nights (Patient not taking: Reported on 10/03/2018), Disp: 70 g, Rfl: 0 .  NON FORMULARY, flinstone vitamine, Disp: , Rfl:   Social History: Reviewed -  reports that she has been smoking cigarettes. She has a 9.00 pack-year smoking history. She has never used smokeless tobacco.  Physical Assessment:   Vitals:   10/03/18 0949  BP: 127/75  Pulse: 97  Weight: 180 lb (81.6 kg)  Body mass index is 32.92 kg/m.        Physical Examination:   General appearance: Well appearing, and in no distress  Mental status: Alert, oriented to person, place, and time  Skin: Warm & dry  Cardiovascular: Normal heart rate noted  Respiratory: Normal respiratory effort, no distress  Abdomen: Soft, gravid, nontender  Pelvic: Cervical exam deferred         Extremities: Edema: None  Fetal Status:     Movement: Present    No results found for this or any previous visit (from the past 24 hour(s)).  Assessment & Plan:  1) Low-risk pregnancy G1P0 at [redacted]w[redacted]d with an Estimated Date of Delivery: 12/28/18     Labs/procedures/US today: PN2 today  Plan:  Continue routine obstetrical care    Follow-up: Return in about 3 weeks (around 10/24/2018) for LROB web ex.  Orders Placed This Encounter  Procedures  . POC Urinalysis Dipstick OB   Christin Fudge CNM 10/03/2018 10:04 AM

## 2018-10-04 LAB — GLUCOSE TOLERANCE, 2 HOURS W/ 1HR
Glucose, 1 hour: 149 mg/dL (ref 65–179)
Glucose, 2 hour: 134 mg/dL (ref 65–152)
Glucose, Fasting: 75 mg/dL (ref 65–91)

## 2018-10-04 LAB — CBC
Hematocrit: 30 % — ABNORMAL LOW (ref 34.0–46.6)
Hemoglobin: 10.5 g/dL — ABNORMAL LOW (ref 11.1–15.9)
MCH: 32.6 pg (ref 26.6–33.0)
MCHC: 35 g/dL (ref 31.5–35.7)
MCV: 93 fL (ref 79–97)
Platelets: 214 10*3/uL (ref 150–450)
RBC: 3.22 x10E6/uL — ABNORMAL LOW (ref 3.77–5.28)
RDW: 12.3 % (ref 11.7–15.4)
WBC: 10.1 10*3/uL (ref 3.4–10.8)

## 2018-10-04 LAB — RPR: RPR Ser Ql: NONREACTIVE

## 2018-10-04 LAB — ANTIBODY SCREEN: Antibody Screen: NEGATIVE

## 2018-10-04 LAB — HIV ANTIBODY (ROUTINE TESTING W REFLEX): HIV Screen 4th Generation wRfx: NONREACTIVE

## 2018-10-08 ENCOUNTER — Telehealth: Payer: Self-pay | Admitting: *Deleted

## 2018-10-08 NOTE — Telephone Encounter (Signed)
Patient states she saw her hemoglobin was slightly low and was told to continue to take PNV but wants to know if she should add an iron supplement. Advised patient that was fine for her to add an iron supplement along with the PNV and could increase her diet in green leafy veggies, red meat, etc.  Verbalized understanding.

## 2018-10-08 NOTE — Telephone Encounter (Signed)
Pt states her lab results showed she is borderline anemic and wants to know if she should start taking a iron pill.

## 2018-10-24 ENCOUNTER — Encounter: Payer: Self-pay | Admitting: Advanced Practice Midwife

## 2018-10-24 ENCOUNTER — Telehealth (INDEPENDENT_AMBULATORY_CARE_PROVIDER_SITE_OTHER): Payer: Medicaid Other | Admitting: Advanced Practice Midwife

## 2018-10-24 ENCOUNTER — Other Ambulatory Visit: Payer: Self-pay

## 2018-10-24 DIAGNOSIS — Z3403 Encounter for supervision of normal first pregnancy, third trimester: Secondary | ICD-10-CM | POA: Diagnosis not present

## 2018-10-24 NOTE — Progress Notes (Signed)
   TELEHEALTH VIRTUAL OBSTETRICS VISIT ENCOUNTER NOTE  I connected with Lori Barnes on 10/24/18 at 10:00 AM EDT by telephone at home and verified that I am speaking with the correct person using two identifiers.   I discussed the limitations, risks, security and privacy concerns of performing an evaluation and management service by telephone and the availability of in person appointments. I also discussed with the patient that there may be a patient responsible charge related to this service. The patient expressed understanding and agreed to proceed.  Subjective:  Lori Barnes is a 28 y.o. G1P0 at [redacted]w[redacted]d being followed for ongoing prenatal care.  She is currently monitored for the following issues for this low-risk pregnancy and has Supervision of normal first pregnancy and S/P LEEP on their problem list.  Patient reports nausea. Started at [facility] part time. Still having some nausea, zofran not working as well. Can try 6-8 mg.  Reports fetal movement. Denies any contractions, bleeding or leaking of fluid.   The following portions of the patient's history were reviewed and updated as appropriate: allergies, current medications, past family history, past medical history, past social history, past surgical history and problem list.   Objective:   General:  Alert, oriented and cooperative.   Mental Status: Normal mood and affect perceived. Normal judgment and thought content.  Rest of physical exam deferred due to type of encounter  Assessment and Plan:  Pregnancy: G1P0 at [redacted]w[redacted]d There are no diagnoses linked to this encounter. Preterm labor symptoms and general obstetric precautions including but not limited to vaginal bleeding, contractions, leaking of fluid and fetal movement were reviewed in detail with the patient.  I discussed the assessment and treatment plan with the patient. The patient was provided an opportunity to ask questions and all were answered. The patient agreed with the  plan and demonstrated an understanding of the instructions. The patient was advised to call back or seek an in-person office evaluation/go to [facility] at [facility] for any urgent or concerning symptoms. Please refer to After Visit Summary for other counseling recommendations.   I provided 15 minutes of non-face-to-face time during this encounter.  Return in about 2 weeks (around 11/07/2018) for [place].  No future appointments.  Christin Fudge, [place] for [employer], [place]

## 2018-10-24 NOTE — Patient Instructions (Signed)
Lori Barnes, I greatly value your feedback.  If you receive a survey following your visit with us today, we appreciate you taking the time to fill it out.  Thanks, Fran Cresenzo-Dishmon, CNM   Call the office ([phone]) or go to [facility] if:  You begin to have strong, frequent contractions  Your water breaks.  Sometimes it is a big gush of fluid, sometimes it is just a trickle that keeps getting your panties wet or running down your legs  You have vaginal bleeding.  It is normal to have a small amount of spotting if your cervix was checked.   You don't feel your baby moving like normal.  If you don't, get you something to eat and drink and lay down and focus on feeling your baby move.  You should feel at least 10 movements in 2 hours.  If you don't, you should call the office or go to [facility].    Tdap Vaccine  It is recommended that you get the Tdap vaccine during the third trimester of EACH pregnancy to help protect your baby from getting pertussis (whooping cough)  27-36 weeks is the BEST time to do this so that you can pass the protection on to your baby. During pregnancy is better than after pregnancy, but if you are unable to get it during pregnancy it will be offered at the hospital.   You will be offered this vaccine in the office after 27 weeks. If you do not have health insurance, you can get this vaccine at the health department or your family doctor  Everyone who will be around your baby should also be up-to-date on their vaccines. Adults (who are not pregnant) only need 1 dose of Tdap during adulthood.   Third Trimester of Pregnancy The third trimester is from week 29 through week 42, months 7 through 9. The third trimester is a time when the fetus is growing rapidly. At the end of the ninth month, the fetus is about 20 inches in length and weighs 6-10 pounds.  BODY CHANGES Your body goes through many changes during pregnancy. The changes vary from woman to  woman.   Your weight will continue to increase. You can expect to gain 25-35 pounds (11-16 kg) by the end of the pregnancy.  You may begin to get stretch marks on your hips, abdomen, and breasts.  You may urinate more often because the fetus is moving lower into your pelvis and pressing on your bladder.  You may develop or continue to have heartburn as a result of your pregnancy.  You may develop constipation because certain hormones are causing the muscles that push waste through your intestines to slow down.  You may develop hemorrhoids or swollen, bulging veins (varicose veins).  You may have pelvic pain because of the weight gain and pregnancy hormones relaxing your joints between the bones in your pelvis. Backaches may result from overexertion of the muscles supporting your posture.  You may have changes in your hair. These can include thickening of your hair, rapid growth, and changes in texture. Some women also have hair loss during or after pregnancy, or hair that feels dry or thin. Your hair will most likely return to normal after your baby is born.  Your breasts will continue to grow and be tender. A yellow discharge may leak from your breasts called colostrum.  Your belly button may stick out.  You may feel short of breath because of your expanding uterus.    You may notice the fetus "dropping," or moving lower in your abdomen.  You may have a bloody mucus discharge. This usually occurs a few days to a week before labor begins.  Your cervix becomes thin and soft (effaced) near your due date. WHAT TO EXPECT AT YOUR PRENATAL EXAMS  You will have prenatal exams every 2 weeks until week 36. Then, you will have weekly prenatal exams. During a routine prenatal visit:  You will be weighed to make sure you and the fetus are growing normally.  Your blood pressure is taken.  Your abdomen will be measured to track your baby's growth.  The fetal heartbeat will be listened  to.  Any test results from the previous visit will be discussed.  You may have a cervical check near your due date to see if you have effaced. At around 36 weeks, your caregiver will check your cervix. At the same time, your caregiver will also perform a test on the secretions of the vaginal tissue. This test is to determine if a type of bacteria, Group B streptococcus, is present. Your caregiver will explain this further. Your caregiver may ask you:  What your birth plan is.  How you are feeling.  If you are feeling the baby move.  If you have had any abnormal symptoms, such as leaking fluid, bleeding, severe headaches, or abdominal cramping.  If you have any questions. Other tests or screenings that may be performed during your third trimester include:  Blood tests that check for low iron levels (anemia).  Fetal testing to check the health, activity level, and growth of the fetus. Testing is done if you have certain medical conditions or if there are problems during the pregnancy. FALSE LABOR You may feel small, irregular contractions that eventually go away. These are called Braxton Hicks contractions, or false labor. Contractions may last for hours, days, or even weeks before true labor sets in. If contractions come at regular intervals, intensify, or become painful, it is best to be seen by your caregiver.  SIGNS OF LABOR   Menstrual-like cramps.  Contractions that are 5 minutes apart or less.  Contractions that start on the top of the uterus and spread down to the lower abdomen and back.  A sense of increased pelvic pressure or back pain.  A watery or bloody mucus discharge that comes from the vagina. If you have any of these signs before the 37th week of pregnancy, call your caregiver right away. You need to go to the hospital to get checked immediately. HOME CARE INSTRUCTIONS   Avoid all smoking, herbs, alcohol, and unprescribed drugs. These chemicals affect the  formation and growth of the baby.  Follow your caregiver's instructions regarding medicine use. There are medicines that are either safe or unsafe to take during pregnancy.  Exercise only as directed by your caregiver. Experiencing uterine cramps is a good sign to stop exercising.  Continue to eat regular, healthy meals.  Wear a good support bra for breast tenderness.  Do not use hot tubs, steam rooms, or saunas.  Wear your seat belt at all times when driving.  Avoid raw meat, uncooked cheese, cat litter boxes, and soil used by cats. These carry germs that can cause birth defects in the baby.  Take your prenatal vitamins.  Try taking a stool softener (if your caregiver approves) if you develop constipation. Eat more high-fiber foods, such as fresh vegetables or fruit and whole grains. Drink plenty of fluids to keep your urine  clear or pale yellow.  Take warm sitz baths to soothe any pain or discomfort caused by hemorrhoids. Use hemorrhoid cream if your caregiver approves.  If you develop varicose veins, wear support hose. Elevate your feet for 15 minutes, 3-4 times a day. Limit salt in your diet.  Avoid heavy lifting, wear low heal shoes, and practice good posture.  Rest a lot with your legs elevated if you have leg cramps or low back pain.  Visit your dentist if you have not gone during your pregnancy. Use a soft toothbrush to brush your teeth and be gentle when you floss.  A sexual relationship may be continued unless your caregiver directs you otherwise.  Do not travel far distances unless it is absolutely necessary and only with the approval of your caregiver.  Take prenatal classes to understand, practice, and ask questions about the labor and delivery.  Make a trial run to the hospital.  Pack your hospital bag.  Prepare the baby's nursery.  Continue to go to all your prenatal visits as directed by your caregiver. SEEK MEDICAL CARE IF:  You are unsure if you are in  labor or if your water has broken.  You have dizziness.  You have mild pelvic cramps, pelvic pressure, or nagging pain in your abdominal area.  You have persistent nausea, vomiting, or diarrhea.  You have a bad smelling vaginal discharge.  You have pain with urination. SEEK IMMEDIATE MEDICAL CARE IF:   You have a fever.  You are leaking fluid from your vagina.  You have spotting or bleeding from your vagina.  You have severe abdominal cramping or pain.  You have rapid weight loss or gain.  You have shortness of breath with chest pain.  You notice sudden or extreme swelling of your face, hands, ankles, feet, or legs.  You have not felt your baby move in over an hour.  You have severe headaches that do not go away with medicine.  You have vision changes. Document Released: 02/28/2001 Document Revised: 03/11/2013 Document Reviewed: 05/07/2012 [facility] Patient Information 2015 [place], [place]. This information is not intended to replace advice given to you by your health care provider. Make sure you discuss any questions you have with your health care provider.

## 2018-10-28 ENCOUNTER — Encounter: Payer: Self-pay | Admitting: Advanced Practice Midwife

## 2018-10-29 ENCOUNTER — Encounter: Payer: Self-pay | Admitting: *Deleted

## 2018-11-07 ENCOUNTER — Encounter: Payer: Self-pay | Admitting: Advanced Practice Midwife

## 2018-11-07 ENCOUNTER — Other Ambulatory Visit: Payer: Self-pay

## 2018-11-07 ENCOUNTER — Ambulatory Visit (INDEPENDENT_AMBULATORY_CARE_PROVIDER_SITE_OTHER): Payer: Medicaid Other | Admitting: Advanced Practice Midwife

## 2018-11-07 VITALS — BP 136/82 | HR 101 | Wt 183.4 lb

## 2018-11-07 DIAGNOSIS — Z1389 Encounter for screening for other disorder: Secondary | ICD-10-CM

## 2018-11-07 DIAGNOSIS — Z3A32 32 weeks gestation of pregnancy: Secondary | ICD-10-CM

## 2018-11-07 DIAGNOSIS — Z23 Encounter for immunization: Secondary | ICD-10-CM | POA: Diagnosis not present

## 2018-11-07 DIAGNOSIS — Z331 Pregnant state, incidental: Secondary | ICD-10-CM

## 2018-11-07 DIAGNOSIS — Z3403 Encounter for supervision of normal first pregnancy, third trimester: Secondary | ICD-10-CM

## 2018-11-07 NOTE — Patient Instructions (Signed)
Lori Barnes, I greatly value your feedback.  If you receive a survey following your visit with [place] today, we appreciate you taking the time to fill it out.  Thanks, Nigel Berthold, CNM   Call the office [phone]) or go to [facility] if:  You begin to have strong, frequent contractions  Your water breaks.  Sometimes it is a big gush of fluid, sometimes it is just a trickle that keeps getting your panties wet or running down your legs  You have vaginal bleeding.  It is normal to have a small amount of spotting if your cervix was checked.   You don't feel your baby moving like normal.  If you don't, get you something to eat and drink and lay down and focus on feeling your baby move.  You should feel at least 10 movements in 2 hours.  If you don't, you should call the office or go to [facility].    Tdap Vaccine  It is recommended that you get the Tdap vaccine during the third trimester of EACH pregnancy to help protect your baby from getting pertussis (whooping cough)  27-36 weeks is the BEST time to do this so that you can pass the protection on to your baby. During pregnancy is better than after pregnancy, but if you are unable to get it during pregnancy it will be offered at the hospital.   You will be offered this vaccine in the office after 27 weeks. If you do not have health insurance, you can get this vaccine at the health department or your family doctor  Everyone who will be around your baby should also be up-to-date on their vaccines. Adults (who are not pregnant) only need 1 dose of Tdap during adulthood.   Third Trimester of Pregnancy The third trimester is from week 29 through week 42, months 7 through 9. The third trimester is a time when the fetus is growing rapidly. At the end of the ninth month, the fetus is about 20 inches in length and weighs 6-10 pounds.  BODY CHANGES Your body goes through many changes during pregnancy. The changes vary from woman to  woman.   Your weight will continue to increase. You can expect to gain 25-35 pounds (11-16 kg) by the end of the pregnancy.  You may begin to get stretch marks on your hips, abdomen, and breasts.  You may urinate more often because the fetus is moving lower into your pelvis and pressing on your bladder.  You may develop or continue to have heartburn as a result of your pregnancy.  You may develop constipation because certain hormones are causing the muscles that push waste through your intestines to slow down.  You may develop hemorrhoids or swollen, bulging veins (varicose veins).  You may have pelvic pain because of the weight gain and pregnancy hormones relaxing your joints between the bones in your pelvis. Backaches may result from overexertion of the muscles supporting your posture.  You may have changes in your hair. These can include thickening of your hair, rapid growth, and changes in texture. Some women also have hair loss during or after pregnancy, or hair that feels dry or thin. Your hair will most likely return to normal after your baby is born.  Your breasts will continue to grow and be tender. A yellow discharge may leak from your breasts called colostrum.  Your belly button may stick out.  You may feel short of breath because of your expanding uterus.  You may notice the fetus "dropping," or moving lower in your abdomen.  You may have a bloody mucus discharge. This usually occurs a few days to a week before labor begins.  Your cervix becomes thin and soft (effaced) near your due date. WHAT TO EXPECT AT YOUR PRENATAL EXAMS  You will have prenatal exams every 2 weeks until week 36. Then, you will have weekly prenatal exams. During a routine prenatal visit:  You will be weighed to make sure you and the fetus are growing normally.  Your blood pressure is taken.  Your abdomen will be measured to track your baby's growth.  The fetal heartbeat will be listened  to.  Any test results from the previous visit will be discussed.  You may have a cervical check near your due date to see if you have effaced. At around 36 weeks, your caregiver will check your cervix. At the same time, your caregiver will also perform a test on the secretions of the vaginal tissue. This test is to determine if a type of bacteria, Group B streptococcus, is present. Your caregiver will explain this further. Your caregiver may ask you:  What your birth plan is.  How you are feeling.  If you are feeling the baby move.  If you have had any abnormal symptoms, such as leaking fluid, bleeding, severe headaches, or abdominal cramping.  If you have any questions. Other tests or screenings that may be performed during your third trimester include:  Blood tests that check for low iron levels (anemia).  Fetal testing to check the health, activity level, and growth of the fetus. Testing is done if you have certain medical conditions or if there are problems during the pregnancy. FALSE LABOR You may feel small, irregular contractions that eventually go away. These are called Braxton Hicks contractions, or false labor. Contractions may last for hours, days, or even weeks before true labor sets in. If contractions come at regular intervals, intensify, or become painful, it is best to be seen by your caregiver.  SIGNS OF LABOR   Menstrual-like cramps.  Contractions that are 5 minutes apart or less.  Contractions that start on the top of the uterus and spread down to the lower abdomen and back.  A sense of increased pelvic pressure or back pain.  A watery or bloody mucus discharge that comes from the vagina. If you have any of these signs before the 37th week of pregnancy, call your caregiver right away. You need to go to the hospital to get checked immediately. HOME CARE INSTRUCTIONS   Avoid all smoking, herbs, alcohol, and unprescribed drugs. These chemicals affect the  formation and growth of the baby.  Follow your caregiver's instructions regarding medicine use. There are medicines that are either safe or unsafe to take during pregnancy.  Exercise only as directed by your caregiver. Experiencing uterine cramps is a good sign to stop exercising.  Continue to eat regular, healthy meals.  Wear a good support bra for breast tenderness.  Do not use hot tubs, steam rooms, or saunas.  Wear your seat belt at all times when driving.  Avoid raw meat, uncooked cheese, cat litter boxes, and soil used by cats. These carry germs that can cause birth defects in the baby.  Take your prenatal vitamins.  Try taking a stool softener (if your caregiver approves) if you develop constipation. Eat more high-fiber foods, such as fresh vegetables or fruit and whole grains. Drink plenty of fluids to keep your urine  clear or pale yellow.  Take warm sitz baths to soothe any pain or discomfort caused by hemorrhoids. Use hemorrhoid cream if your caregiver approves.  If you develop varicose veins, wear support hose. Elevate your feet for 15 minutes, 3-4 times a day. Limit salt in your diet.  Avoid heavy lifting, wear low heal shoes, and practice good posture.  Rest a lot with your legs elevated if you have leg cramps or low back pain.  Visit your dentist if you have not gone during your pregnancy. Use a soft toothbrush to brush your teeth and be gentle when you floss.  A sexual relationship may be continued unless your caregiver directs you otherwise.  Do not travel far distances unless it is absolutely necessary and only with the approval of your caregiver.  Take prenatal classes to understand, practice, and ask questions about the labor and delivery.  Make a trial run to the hospital.  Pack your hospital bag.  Prepare the baby's nursery.  Continue to go to all your prenatal visits as directed by your caregiver. SEEK MEDICAL CARE IF:  You are unsure if you are in  labor or if your water has broken.  You have dizziness.  You have mild pelvic cramps, pelvic pressure, or nagging pain in your abdominal area.  You have persistent nausea, vomiting, or diarrhea.  You have a bad smelling vaginal discharge.  You have pain with urination. SEEK IMMEDIATE MEDICAL CARE IF:   You have a fever.  You are leaking fluid from your vagina.  You have spotting or bleeding from your vagina.  You have severe abdominal cramping or pain.  You have rapid weight loss or gain.  You have shortness of breath with chest pain.  You notice sudden or extreme swelling of your face, hands, ankles, feet, or legs.  You have not felt your baby move in over an hour.  You have severe headaches that do not go away with medicine.  You have vision changes. Document Released: 02/28/2001 Document Revised: 03/11/2013 Document Reviewed: 05/07/2012 [facility] Patient Information 2015 [place], [place]. This information is not intended to replace advice given to you by your health care provider. Make sure you discuss any questions you have with your health care provider.

## 2018-11-07 NOTE — Addendum Note (Signed)
Addended by: Linton Rump on: 11/07/2018 04:28 PM   Modules accepted: Orders

## 2018-11-07 NOTE — Progress Notes (Addendum)
LOW-RISK PREGNANCY VISIT Patient name: Lori Barnes MRN [id number]  Date of birth: 07/19/1990 Chief Complaint:   Routine Prenatal Visit  History of Present Illness:   Lori BargesDanielle A Mcclean is a 28 y.o. G1P0 female at 5046w5d with an Estimated Date of Delivery: 12/28/18 being seen today for ongoing management of a low-risk pregnancy.  Today she reports no complaints. Contractions: Irregular. Vag. Bleeding: None.  Movement: Present. denies leaking of fluid.    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  Pt aware that she can go back to work whenever she wants as far as we are concerned. Review of Systems:   Pertinent items are noted in HPI Denies abnormal vaginal discharge w/ itching/odor/irritation, headaches, visual changes, shortness of breath, chest pain, abdominal pain, severe nausea/vomiting, or problems with urination or bowel movements unless otherwise stated above.  Pertinent History Reviewed:  Medical & Surgical Hx:   Past Medical History:  Diagnosis Date  . Vaginal Pap smear, abnormal    Past Surgical History:  Procedure Laterality Date  . LEEP    . WISDOM TOOTH EXTRACTION     Family History  Problem Relation Age of Onset  . Hodgkin's lymphoma Mother   . Cancer Father   . Hypertension Maternal Grandmother   . Cancer Paternal Grandfather        oral    Current Outpatient Medications:  .  docusate sodium (COLACE) 100 MG capsule, Take 1 capsule (100 mg total) by mouth 2 (two) times daily as needed., Disp: 30 capsule, Rfl: 2 .  ferrous sulfate 325 (65 FE) MG tablet, Take 325 mg by mouth daily with breakfast., Disp: , Rfl:  .  omeprazole (PRILOSEC) 20 MG capsule, Take 1 capsule (20 mg total) by mouth daily., Disp: 30 capsule, Rfl: 3 .  ondansetron (ZOFRAN) 4 MG tablet, Take 1 tablet (4 mg total) by mouth every 8 (eight) hours as needed for nausea or vomiting., Disp: 16 tablet, Rfl: 2  .  Prenatal Vit-Fe Fumarate-FA (PRENATAL MULTIVITAMIN) TABS tablet, Take 1 tablet by mouth daily at 12 noon., Disp: , Rfl:  Social History: Reviewed -  reports that she has been smoking cigarettes. She has a 9.00 pack-year smoking history. She has never used smokeless tobacco.  Physical Assessment:   Vitals:   11/07/18 1351  BP: 136/82  Pulse: (!) 101  Weight: 183 lb 6.4 oz (83.2 kg)  Body mass index is 33.54 kg/m.        Physical Examination:   General appearance: Well appearing, and in no distress  Mental status: Alert, oriented to person, place, and time  Skin: Warm & dry  Cardiovascular: Normal heart rate noted  Respiratory: Normal respiratory effort, no distress  Abdomen: Soft, gravid, nontender  Pelvic: Cervical exam deferred         Extremities: Edema: Trace  Fetal Status: Fetal Heart Rate (bpm): 150 Fundal Height: 33 cm Movement: Present    No results found for this or any previous visit (from the past 24 hour(s)).  Assessment & Plan:  1) Low-risk pregnancy G1P0 at 446w5d with an Estimated Date of Delivery: 12/28/18      Labs/procedures/US today: Tdap  Plan:  Continue routine obstetrical care    Follow-up: Return in about 2 weeks (around 11/21/2018) for OB Mychart visit.  Orders Placed This Encounter  Procedures  . Tdap vaccine greater than or equal to 7yo IM  .  POC Urinalysis Dipstick OB   Christin Fudge CNM 11/07/2018 2:14 PM

## 2018-11-21 ENCOUNTER — Telehealth (INDEPENDENT_AMBULATORY_CARE_PROVIDER_SITE_OTHER): Payer: Medicaid Other | Admitting: Women's Health

## 2018-11-21 ENCOUNTER — Encounter: Payer: Self-pay | Admitting: Women's Health

## 2018-11-21 ENCOUNTER — Other Ambulatory Visit: Payer: Self-pay | Admitting: Obstetrics & Gynecology

## 2018-11-21 ENCOUNTER — Other Ambulatory Visit: Payer: Self-pay

## 2018-11-21 VITALS — BP 137/88 | HR 110 | Ht 61.0 in

## 2018-11-21 DIAGNOSIS — Z3A34 34 weeks gestation of pregnancy: Secondary | ICD-10-CM

## 2018-11-21 DIAGNOSIS — Z3402 Encounter for supervision of normal first pregnancy, second trimester: Secondary | ICD-10-CM

## 2018-11-21 DIAGNOSIS — Z3403 Encounter for supervision of normal first pregnancy, third trimester: Secondary | ICD-10-CM

## 2018-11-21 NOTE — Progress Notes (Signed)
   TELEHEALTH VIRTUAL OBSTETRICS VISIT ENCOUNTER NOTE Patient name: Lori Barnes MRN [id number]  Date of birth: 09/21/1990  I connected with patient on 11/21/18 at  1:30 PM EDT by MyChart video  and verified that I am speaking with the correct person using two identifiers. Due to COVID-19 recommendations, pt is not currently in our office.    I discussed the limitations, risks, security and privacy concerns of performing an evaluation and management service by telephone and the availability of in person appointments. I also discussed with the patient that there may be a patient responsible charge related to this service. The patient expressed understanding and agreed to proceed.  Chief Complaint:   Routine Prenatal Visit  History of Present Illness:   Lori Barnes is a 28 y.o. G1P0 female at [redacted]w[redacted]d with an Estimated Date of Delivery: 12/28/18 being evaluated today for ongoing management of a low-risk pregnancy.  Today she reports no complaints. Contractions: Irregular. Vag. Bleeding: None.  Movement: Present. denies leaking of fluid. Review of Systems:   Pertinent items are noted in HPI Denies abnormal vaginal discharge w/ itching/odor/irritation, headaches, visual changes, shortness of breath, chest pain, abdominal pain, severe nausea/vomiting, or problems with urination or bowel movements unless otherwise stated above. Pertinent History Reviewed:  Reviewed past medical,surgical, social, obstetrical and family history.  Reviewed problem list, medications and allergies. Physical Assessment:   Vitals:   11/21/18 1343  BP: 137/88  Pulse: (!) 110  Height: 5\' 1"  (1.549 m)  Body mass index is 34.65 kg/m.        Physical Examination:   General:  Alert, oriented and cooperative.   Mental Status: Normal mood and affect perceived. Normal judgment and thought content.  Rest of physical exam deferred due to type of encounter  No results found for this or any previous visit (from the past 24  hour(s)).  Assessment & Plan:  1) Pregnancy G1P0 at [redacted]w[redacted]d with an Estimated Date of Delivery: 12/28/18    Meds: No orders of the defined types were placed in this encounter.  Labs/procedures today: none  Plan:  Continue routine obstetrical care.  Has home bp cuff.  Check bp weekly, let us know if >140/90.   Reviewed: Preterm labor symptoms and general obstetric precautions including but not limited to vaginal bleeding, contractions, leaking of fluid and fetal movement were reviewed in detail with the patient. The patient was advised to call back or seek an in-person office evaluation/go to [facility]U at [facility] for any urgent or concerning symptoms. All questions were answered. Please refer to After Visit Summary for other counseling recommendations.    I provided 10 minutes of non-face-to-face time during this encounter.  Follow-up: Return in about 2 weeks (around 12/05/2018) for [place], in person.  No orders of the defined types were placed in this encounter.  [place], [facility] 11/21/2018 1:49 PM

## 2018-11-21 NOTE — Patient Instructions (Signed)
Lori Barnes, I greatly value your feedback.  If you receive a survey following your visit with us today, we appreciate you taking the time to fill it out.  Thanks, [name] , CNM, [facility]BC  [facility] MOV[facility]!!! It is now [facility] ([address] [place], [place] [address]) Entrance located off of [address] Free 24/7 valet parking   Go to [email or web address] to register for FREE online childbirth classes    Call the office [phone]) or go to [facility] if:  You begin to have strong, frequent contractions  Your water breaks.  Sometimes it is a big gush of fluid, sometimes it is just a trickle that keeps getting your panties wet or running down your legs  You have vaginal bleeding.  It is normal to have a small amount of spotting if your cervix was checked.   You don't feel your baby moving like normal.  If you don't, get you something to eat and drink and lay down and focus on feeling your baby move.  You should feel at least 10 movements in 2 hours.  If you don't, you should call the office or go to [facility].   Home Blood Pressure Monitoring for Patients   Your provider has recommended that you check your blood pressure (BP) at least once a week at home. If you do not have a blood pressure cuff at home, one will be provided for you. Contact your provider if you have not received your monitor within 1 week.   Helpful Tips for Accurate Home Blood Pressure Checks  . Don't smoke, exercise, or drink caffeine 30 minutes before checking your BP . Use the restroom before checking your BP (a full bladder can raise your pressure) . Relax in a comfortable upright chair . Feet on the ground . Left arm resting comfortably on a flat surface at the level of your heart . Legs uncrossed . Back supported . Sit quietly and don't talk . Place the cuff on your bare arm . Adjust snuggly, so that only two fingertips can fit between your skin  and the top of the cuff . Check 2 readings separated by at least one minute . Keep a log of your BP readings . For a visual, please reference this diagram: http://ccnc.care/bpdiagram  Provider Name: [facility]     Phone: [phone]  Zone 1: ALL CLEAR  Continue to monitor your symptoms:  . BP reading is less than 140 (top number) or less than 90 (bottom number)  . No right upper stomach pain . No headaches or seeing spots . No feeling nauseated or throwing up . No swelling in face and hands  Zone 2: CAUTION Call your doctor's office for any of the following:  . BP reading is greater than 140 (top number) or greater than 90 (bottom number)  . Stomach pain under your ribs in the middle or right side . Headaches or seeing spots . Feeling nauseated or throwing up . Swelling in face and hands  Zone 3: EMERGENCY  Seek immediate medical care if you have any of the following:  . BP reading is greater than160 (top number) or greater than 110 (bottom number) . Severe headaches not improving with Tylenol . Serious difficulty catching your breath . Any worsening symptoms from Zone 2     Preterm Labor and Birth Information  The normal length of a pregnancy is 39-41 weeks. Preterm labor is when labor  starts before 37 completed weeks of pregnancy. What are the risk factors for preterm labor? Preterm labor is more likely to occur in women who:  Have certain infections during pregnancy such as a bladder infection, sexually transmitted infection, or infection inside the uterus (chorioamnionitis).  Have a shorter-than-normal cervix.  Have gone into preterm labor before.  Have had surgery on their cervix.  Are younger than age 70 or older than age 79.  Are African American.  Are pregnant with twins or multiple babies (multiple gestation).  Take street drugs or smoke while pregnant.  Do not gain enough weight while pregnant.  Became pregnant shortly after having been  pregnant. What are the symptoms of preterm labor? Symptoms of preterm labor include:  Cramps similar to those that can happen during a menstrual period. The cramps may happen with diarrhea.  Pain in the abdomen or lower back.  Regular uterine contractions that may feel like tightening of the abdomen.  A feeling of increased pressure in the pelvis.  Increased watery or bloody mucus discharge from the vagina.  Water breaking (ruptured amniotic sac). Why is it important to recognize signs of preterm labor? It is important to recognize signs of preterm labor because babies who are born prematurely may not be fully developed. This can put them at an increased risk for:  Long-term (chronic) heart and lung problems.  Difficulty immediately after birth with regulating body systems, including blood sugar, body temperature, heart rate, and breathing rate.  Bleeding in the brain.  Cerebral palsy.  Learning difficulties.  Death. These risks are highest for babies who are born before 33 weeks of pregnancy. How is preterm labor treated? Treatment depends on the length of your pregnancy, your condition, and the health of your baby. It may involve:  Having a stitch (suture) placed in your cervix to prevent your cervix from opening too early (cerclage).  Taking or being given medicines, such as: ? Hormone medicines. These may be given early in pregnancy to help support the pregnancy. ? Medicine to stop contractions. ? Medicines to help mature the baby's lungs. These may be prescribed if the risk of delivery is high. ? Medicines to prevent your baby from developing cerebral palsy. If the labor happens before 34 weeks of pregnancy, you may need to stay in the hospital. What should I do if I think I am in preterm labor? If you think that you are going into preterm labor, call your health care provider right away. How can I prevent preterm labor in future pregnancies? To increase your chance  of having a full-term pregnancy:  Do not use any tobacco products, such as cigarettes, chewing tobacco, and e-cigarettes. If you need help quitting, ask your health care provider.  Do not use street drugs or medicines that have not been prescribed to you during your pregnancy.  Talk with your health care provider before taking any herbal supplements, even if you have been taking them regularly.  Make sure you gain a healthy amount of weight during your pregnancy.  Watch for infection. If you think that you might have an infection, get it checked right away.  Make sure to tell your health care provider if you have gone into preterm labor before. This information is not intended to replace advice given to you by your health care provider. Make sure you discuss any questions you have with your health care provider. Document Released: 05/27/2003 Document Revised: 06/28/2018 Document Reviewed: 07/28/2015 Elsevier Patient Education  2020 [employer].

## 2018-12-04 DIAGNOSIS — H5213 Myopia, bilateral: Secondary | ICD-10-CM | POA: Diagnosis not present

## 2018-12-05 ENCOUNTER — Encounter: Payer: Medicaid Other | Admitting: Family Medicine

## 2018-12-06 DIAGNOSIS — H5213 Myopia, bilateral: Secondary | ICD-10-CM | POA: Diagnosis not present

## 2018-12-11 ENCOUNTER — Encounter (HOSPITAL_COMMUNITY): Payer: Self-pay | Admitting: *Deleted

## 2018-12-11 ENCOUNTER — Encounter: Payer: Self-pay | Admitting: Advanced Practice Midwife

## 2018-12-11 ENCOUNTER — Other Ambulatory Visit: Payer: Self-pay

## 2018-12-11 ENCOUNTER — Ambulatory Visit (INDEPENDENT_AMBULATORY_CARE_PROVIDER_SITE_OTHER): Payer: Medicaid Other | Admitting: Advanced Practice Midwife

## 2018-12-11 ENCOUNTER — Inpatient Hospital Stay (HOSPITAL_COMMUNITY)
Admission: AD | Admit: 2018-12-11 | Discharge: 2018-12-16 | DRG: 788 | Disposition: A | Discharge status: Home / Self Care | Payer: Medicaid Other | Attending: Obstetrics & Gynecology | Admitting: Obstetrics & Gynecology

## 2018-12-11 VITALS — BP 144/96 | HR 111 | Wt 201.0 lb

## 2018-12-11 DIAGNOSIS — Z30017 Encounter for initial prescription of implantable subdermal contraceptive: Secondary | ICD-10-CM

## 2018-12-11 DIAGNOSIS — Z331 Pregnant state, incidental: Secondary | ICD-10-CM

## 2018-12-11 DIAGNOSIS — F17210 Nicotine dependence, cigarettes, uncomplicated: Secondary | ICD-10-CM | POA: Diagnosis present

## 2018-12-11 DIAGNOSIS — O139 Gestational [pregnancy-induced] hypertension without significant proteinuria, unspecified trimester: Secondary | ICD-10-CM

## 2018-12-11 DIAGNOSIS — J22 Unspecified acute lower respiratory infection: Secondary | ICD-10-CM

## 2018-12-11 DIAGNOSIS — O134 Gestational [pregnancy-induced] hypertension without significant proteinuria, complicating childbirth: Secondary | ICD-10-CM | POA: Diagnosis not present

## 2018-12-11 DIAGNOSIS — O133 Gestational [pregnancy-induced] hypertension without significant proteinuria, third trimester: Secondary | ICD-10-CM

## 2018-12-11 DIAGNOSIS — Z9889 Other specified postprocedural states: Secondary | ICD-10-CM

## 2018-12-11 DIAGNOSIS — R05 Cough: Secondary | ICD-10-CM | POA: Diagnosis not present

## 2018-12-11 DIAGNOSIS — Z98891 History of uterine scar from previous surgery: Secondary | ICD-10-CM

## 2018-12-11 DIAGNOSIS — Z3403 Encounter for supervision of normal first pregnancy, third trimester: Secondary | ICD-10-CM

## 2018-12-11 DIAGNOSIS — Z20828 Contact with and (suspected) exposure to other viral communicable diseases: Secondary | ICD-10-CM | POA: Diagnosis present

## 2018-12-11 DIAGNOSIS — O9972 Diseases of the skin and subcutaneous tissue complicating childbirth: Secondary | ICD-10-CM | POA: Diagnosis present

## 2018-12-11 DIAGNOSIS — Z1389 Encounter for screening for other disorder: Secondary | ICD-10-CM

## 2018-12-11 DIAGNOSIS — O324XX0 Maternal care for high head at term, not applicable or unspecified: Secondary | ICD-10-CM | POA: Diagnosis not present

## 2018-12-11 DIAGNOSIS — O99334 Smoking (tobacco) complicating childbirth: Secondary | ICD-10-CM | POA: Diagnosis present

## 2018-12-11 DIAGNOSIS — Z3A37 37 weeks gestation of pregnancy: Secondary | ICD-10-CM

## 2018-12-11 DIAGNOSIS — Z3A38 38 weeks gestation of pregnancy: Secondary | ICD-10-CM | POA: Diagnosis not present

## 2018-12-11 DIAGNOSIS — Z3483 Encounter for supervision of other normal pregnancy, third trimester: Secondary | ICD-10-CM

## 2018-12-11 DIAGNOSIS — O34211 Maternal care for low transverse scar from previous cesarean delivery: Secondary | ICD-10-CM | POA: Diagnosis not present

## 2018-12-11 DIAGNOSIS — L989 Disorder of the skin and subcutaneous tissue, unspecified: Secondary | ICD-10-CM | POA: Diagnosis present

## 2018-12-11 DIAGNOSIS — O99824 Streptococcus B carrier state complicating childbirth: Secondary | ICD-10-CM | POA: Diagnosis present

## 2018-12-11 DIAGNOSIS — O99330 Smoking (tobacco) complicating pregnancy, unspecified trimester: Secondary | ICD-10-CM | POA: Diagnosis present

## 2018-12-11 DIAGNOSIS — O99333 Smoking (tobacco) complicating pregnancy, third trimester: Secondary | ICD-10-CM

## 2018-12-11 HISTORY — DX: Gestational (pregnancy-induced) hypertension without significant proteinuria, unspecified trimester: O13.9

## 2018-12-11 LAB — CBC
HCT: 33.3 % — ABNORMAL LOW (ref 36.0–46.0)
Hemoglobin: 11.4 g/dL — ABNORMAL LOW (ref 12.0–15.0)
MCH: 32.9 pg (ref 26.0–34.0)
MCHC: 34.2 g/dL (ref 30.0–36.0)
MCV: 96 fL (ref 80.0–100.0)
Platelets: 174 10*3/uL (ref 150–400)
RBC: 3.47 MIL/uL — ABNORMAL LOW (ref 3.87–5.11)
RDW: 13.4 % (ref 11.5–15.5)
WBC: 12.4 10*3/uL — ABNORMAL HIGH (ref 4.0–10.5)
nRBC: 0 % (ref 0.0–0.2)

## 2018-12-11 LAB — COMPREHENSIVE METABOLIC PANEL
ALT: 13 U/L (ref 0–44)
AST: 22 U/L (ref 15–41)
Albumin: 2.8 g/dL — ABNORMAL LOW (ref 3.5–5.0)
Alkaline Phosphatase: 129 U/L — ABNORMAL HIGH (ref 38–126)
Anion gap: 9 (ref 5–15)
BUN: 5 mg/dL — ABNORMAL LOW (ref 6–20)
CO2: 20 mmol/L — ABNORMAL LOW (ref 22–32)
Calcium: 8.6 mg/dL — ABNORMAL LOW (ref 8.9–10.3)
Chloride: 105 mmol/L (ref 98–111)
Creatinine, Ser: 0.7 mg/dL (ref 0.44–1.00)
GFR calc Af Amer: >60 mL/min (ref >60)
GFR calc non Af Amer: >60 mL/min (ref >60)
Glucose, Bld: 93 mg/dL (ref 70–99)
Potassium: 3.5 mmol/L (ref 3.5–5.1)
Sodium: 134 mmol/L — ABNORMAL LOW (ref 135–145)
Total Bilirubin: 0.4 mg/dL (ref 0.3–1.2)
Total Protein: 6.1 g/dL — ABNORMAL LOW (ref 6.5–8.1)

## 2018-12-11 LAB — TYPE AND SCREEN
ABO/RH(D): O POS
Antibody Screen: NEGATIVE

## 2018-12-11 LAB — OB RESULTS CONSOLE GBS: GBS: POSITIVE

## 2018-12-11 LAB — ABO/RH: ABO/RH(D): O POS

## 2018-12-11 LAB — SARS CORONAVIRUS 2 BY RT PCR (HOSPITAL ORDER, PERFORMED IN ~~LOC~~ HOSPITAL LAB): SARS Coronavirus 2: NEGATIVE

## 2018-12-11 MED ORDER — OXYCODONE-ACETAMINOPHEN 5-325 MG PO TABS: 2.0000 | ORAL_TABLET | ORAL | Status: DC | PRN

## 2018-12-11 MED ORDER — LACTATED RINGERS IV SOLN: 500.0000 mL | INTRAVENOUS | Status: DC | PRN

## 2018-12-11 MED ORDER — MISOPROSTOL 25 MCG QUARTER TABLET
25.0000 ug | ORAL_TABLET | ORAL | Status: DC | PRN
  Administered 2018-12-11 – 2018-12-12 (×3): 25 ug via VAGINAL
  Filled 2018-12-11 (×4): qty 1

## 2018-12-11 MED ORDER — OXYTOCIN BOLUS FROM INFUSION: 500.0000 mL | Freq: Once | INTRAVENOUS | Status: DC

## 2018-12-11 MED ORDER — TERBUTALINE SULFATE 1 MG/ML IJ SOLN: 0.2500 mg | Freq: Once | INTRAMUSCULAR | Status: DC | PRN

## 2018-12-11 MED ORDER — ONDANSETRON HCL 4 MG/2ML IJ SOLN
4.0000 mg | Freq: Four times a day (QID) | INTRAMUSCULAR | Status: DC | PRN
  Administered 2018-12-12 – 2018-12-14 (×4): 4 mg via INTRAVENOUS
  Filled 2018-12-11 (×4): qty 2

## 2018-12-11 MED ORDER — FENTANYL CITRATE (PF) 100 MCG/2ML IJ SOLN
100.0000 ug | INTRAMUSCULAR | Status: DC | PRN
  Administered 2018-12-12 (×7): 100 ug via INTRAVENOUS
  Filled 2018-12-11 (×7): qty 2

## 2018-12-11 MED ORDER — OXYCODONE-ACETAMINOPHEN 5-325 MG PO TABS: 1.0000 | ORAL_TABLET | ORAL | Status: DC | PRN

## 2018-12-11 MED ORDER — ACETAMINOPHEN 325 MG PO TABS
650.0000 mg | ORAL_TABLET | ORAL | Status: DC | PRN
  Administered 2018-12-13: 650 mg via ORAL
  Filled 2018-12-11: qty 2

## 2018-12-11 MED ORDER — SOD CITRATE-CITRIC ACID 500-334 MG/5ML PO SOLN
30.0000 mL | ORAL | Status: DC | PRN
  Administered 2018-12-13 – 2018-12-14 (×3): 30 mL via ORAL
  Filled 2018-12-11 (×3): qty 30

## 2018-12-11 MED ORDER — LIDOCAINE HCL (PF) 1 % IJ SOLN: 30.0000 mL | INTRAMUSCULAR | Status: DC | PRN

## 2018-12-11 MED ORDER — OXYTOCIN 40 UNITS IN NORMAL SALINE INFUSION - SIMPLE MED: 2.5000 [IU]/h | INTRAVENOUS | Status: DC

## 2018-12-11 MED ORDER — LACTATED RINGERS IV SOLN
INTRAVENOUS | Status: DC
  Administered 2018-12-11 – 2018-12-14 (×5): via INTRAVENOUS

## 2018-12-11 NOTE — H&P (Addendum)
OBSTETRIC ADMISSION HISTORY AND PHYSICAL  MARIBELLE HOPPLE is a 28 y.o. female G1P0 with IUP at [redacted]w[redacted]d by 10 week ultrasound presenting for induction of labor for gestational hypertension. She reports a mild HA that started about 1 hour ago. She denies vision changes, RUQ pain, N/V. She denies contractions, LOF, or vaginal bleeding. Reports good fetal movement.  She received her prenatal care at [facility].  Support person in labor: Jimmy  Ultrasounds . Anatomy U/S: normal  Prenatal History/Complications: . Gestational hypertension . S/p Leep  Past Medical History: Past Medical History:  Diagnosis Date  . Vaginal Pap smear, abnormal     Past Surgical History: Past Surgical History:  Procedure Laterality Date  . LEEP    . WISDOM TOOTH EXTRACTION      Obstetrical History: OB History    Gravida  1   Para      Term      Preterm      AB      Living        SAB      TAB      Ectopic      Multiple      Live Births              Social History: Social History   Socioeconomic History  . Marital status: Single    Spouse name: Not on file  . Number of children: Not on file  . Years of education: Not on file  . Highest education level: Not on file  Occupational History  . Not on file  Social Needs  . Financial resource strain: Not on file  . Food insecurity    Worry: Not on file    Inability: Not on file  . Transportation needs    Medical: Not on file    Non-medical: Not on file  Tobacco Use  . Smoking status: Current Every Day Smoker    Packs/day: 1.50    Years: 6.00    Pack years: 9.00    Types: Cigarettes  . Smokeless tobacco: Never Used  . Tobacco comment: 4-5 cigs per day  Substance and Sexual Activity  . Alcohol use: No  . Drug use: No  . Sexual activity: Yes    Birth control/protection: None, Condom  Lifestyle  . Physical activity    Days per week: Not on file    Minutes per session: Not on file  . Stress: Not on file   Relationships  . Social Musician on phone: Not on file    Gets together: Not on file    Attends religious service: Not on file    Active member of club or organization: Not on file    Attends meetings of clubs or organizations: Not on file    Relationship status: Not on file  Other Topics Concern  . Not on file  Social History Narrative  . Not on file    Family History: Family History  Problem Relation Age of Onset  . Hodgkin's lymphoma Mother   . Cancer Father   . Hypertension Maternal Grandmother   . Cancer Paternal Grandfather        oral    Allergies: No Known Allergies  Medications Prior to Admission  Medication Sig Dispense Refill Last Dose  . docusate sodium (COLACE) 100 MG capsule Take 1 capsule (100 mg total) by mouth 2 (two) times daily as needed. 30 capsule 2 12/10/2018 at Unknown time  . ferrous sulfate 325 (  65 FE) MG tablet Take 325 mg by mouth daily with breakfast.   12/10/2018 at Unknown time  . omeprazole (PRILOSEC) 20 MG capsule Take 1 capsule (20 mg total) by mouth daily. 30 capsule 3 12/11/2018 at Unknown time  . ondansetron (ZOFRAN) 4 MG tablet TAKE 1 TABLET BY MOUTH EVERY 8 HOURS AS NEEDED FOR NAUSEA AND VOMITING 16 tablet 2 12/11/2018 at Unknown time  . Prenatal Vit-Fe Fumarate-FA (PRENATAL MULTIVITAMIN) TABS tablet Take 1 tablet by mouth daily at 12 noon.   12/11/2018 at Unknown time     Review of Systems  All systems reviewed and negative except as stated in HPI  Physical Exam  Blood pressure 135/79, pulse 91, temperature 98.4 F (36.9 C), temperature source Oral, resp. rate 18, height 5\' 1"  (1.549 m), weight 91.7 kg, last menstrual period 03/23/2018. General appearance: alert, cooperative and no distress Lungs: no respiratory distress Heart: regular rate  Abdomen: soft, non-tender; gravid  Extremities: Homans sign is negative, no sign of DVT Presentation: cephalic Fetal monitoring: FHR 145. Moderate variability, +accels, no decels   Uterine activity: None Dilation: Closed Effacement (%): Thick Station: -3 Exam by:: Francene Finders, RN   Prenatal labs: ABO, Rh: --/--/O POS, O POS Performed at [facility], [address], [place], [place] [address]  [phone]/23 2052) Antibody: NEG (09/23 2052) Rubella: 1.15 (03/18 1444) RPR: Non Reactive (07/16 0924)  HBsAg: Negative (03/18 1444)  HIV: Non Reactive (07/16 0924)  GBS:   PCR pending Glucola: normal Genetic screening: normal  Prenatal Transfer Tool  Maternal Diabetes: No Genetic Screening: Normal Maternal Ultrasounds/Referrals: Normal Fetal Ultrasounds or other Referrals:  None Maternal Substance Abuse:  Yes:  Type: Smoker Significant Maternal Medications:  None Significant Maternal Lab Results: Other: GBS PCR pending  Results for orders placed or performed during the hospital encounter of 12/11/18 (from the past 24 hour(s))  SARS Coronavirus 2 [facility] order, Performed in [facility] hospital lab) Nasopharyngeal Nasopharyngeal Swab   Collection Time: 12/11/18  7:50 PM   Specimen: Nasopharyngeal Swab  Result Value Ref Range   SARS Coronavirus 2 NEGATIVE NEGATIVE  CBC   Collection Time: 12/11/18  8:52 PM  Result Value Ref Range   WBC 12.4 (H) 4.0 - 10.5 K/uL   RBC 3.47 (L) 3.87 - 5.11 MIL/uL   Hemoglobin 11.4 (L) 12.0 - 15.0 g/dL   HCT 54.6 (L) 50.3 - 54.6 %   MCV 96.0 80.0 - 100.0 fL   MCH 32.9 26.0 - 34.0 pg   MCHC 34.2 30.0 - 36.0 g/dL   RDW 56.8 12.7 - 51.7 %   Platelets 174 150 - 400 K/uL   nRBC 0.0 0.0 - 0.2 %  Comprehensive metabolic panel   Collection Time: 12/11/18  8:52 PM  Result Value Ref Range   Sodium 134 (L) 135 - 145 mmol/L   Potassium 3.5 3.5 - 5.1 mmol/L   Chloride 105 98 - 111 mmol/L   CO2 20 (L) 22 - 32 mmol/L   Glucose, Bld 93 70 - 99 mg/dL   BUN 5 (L) 6 - 20 mg/dL   Creatinine, Ser 0.01 0.44 - 1.00 mg/dL   Calcium 8.6 (L) 8.9 - 10.3 mg/dL   Total Protein 6.1 (L) 6.5 - 8.1 g/dL   Albumin 2.8 (L) 3.5 - 5.0 g/dL   AST  22 15 - 41 U/L   ALT 13 0 - 44 U/L   Alkaline Phosphatase 129 (H) 38 - 126 U/L   Total Bilirubin 0.4 0.3 -  1.2 mg/dL   GFR calc non Af Amer >60 >60 mL/min   GFR calc Af Amer >60 >60 mL/min   Anion gap 9 5 - 15  Type and screen [place]   Collection Time: 12/11/18  8:52 PM  Result Value Ref Range   ABO/RH(D) O POS    Antibody Screen NEG    Sample Expiration      12/14/2018,2359 Performed at [facility], [place] [address] [place], [place] [address]   ABO/Rh   Collection Time: 12/11/18  8:52 PM  Result Value Ref Range   ABO/RH(D)      O POS Performed at [facility] [address], [place], [place] [address]     Patient Active Problem List   Diagnosis Date Noted  . Gestational hypertension 12/11/2018  . Tobacco use complicating pregnancy 02/77/4128  . Supervision of normal first pregnancy 06/05/2018  . S/P LEEP 06/05/2018    Assessment/Plan:  GERALDEAN WALEN is a 28 y.o. G1P0 at [redacted]w[redacted]d here for induction of labor for gestational hypertension.  Labor:  -- Cytotec with possible foley bulb insertion at next Cytotec dose -- GBS PCR pending -- Pre-e labs pending -- Pain control: planning IV pain medication. Epidural offered as option. -- Anticipate NSVD  Fetal Wellbeing:  -- Cephalic by RN VE  -- Continuous fetal monitoring   Postpartum Planning -- Formula -- OCPs    Maryagnes Amos, SNM  CNM attestation:  I have seen and examined this patient; I agree with above documentation in the midwife student's note.   MARALYN WITHERELL is a 28 y.o. G1P0 here for IOL due to gHTN r/o pre-e  PE: BP 135/79   Pulse 91   Temp 98.4 F (36.9 C) (Oral)   Resp 18   Ht 5\' 1"  (1.549 m)   Wt 91.7 kg   LMP 03/23/2018 (Approximate)   BMI 38.21 kg/m  Gen: calm comfortable, NAD Resp: normal effort, no distress Abd: gravid  ROS, CBC/CMET, PMH reviewed  Plan: -Admit to for IOL due to gHTN, r/o pre-e (waiting for urine P/C ratio) -Currently  without severe range BPs; has mild H/A which she will try Tylenol for -Cx unfavorable> cytotec with cervical foley, then Pit/AROM prn -GBS PCR pending -Will reeval neuro symptoms, watch BPs, and await urine P/C results  Myrtis Ser CNM 12/11/2018, 11:30 PM

## 2018-12-11 NOTE — Progress Notes (Signed)
    LOW-RISK PREGNANCY VISIT Patient name: Lori Barnes MRN [id number]  Date of birth: 03-30-1990 Chief Complaint:   Routine Prenatal Visit (gbs-gc-chl)  History of Present Illness:   Lori Barnes is a 28 y.o. G1P0 female at [redacted]w[redacted]d with an Estimated Date of Delivery: 12/28/18 being seen today for ongoing management of a low-risk pregnancy.  Today she reports BPs at home have been 130's/90's for past several days.  Repeat BP in office 152/96  Meets criteria for GHTN. . Contractions: Regular.  .  Movement: Present. denies leaking of fluid. Review of Systems:   Pertinent items are noted in HPI Denies abnormal vaginal discharge w/ itching/odor/irritation, headaches, visual changes, shortness of breath, chest pain, abdominal pain, severe nausea/vomiting, or problems with urination or bowel movements unless otherwise stated above.  Pertinent History Reviewed:  Medical & Surgical Hx:   Past Medical History:  Diagnosis Date  . Vaginal Pap smear, abnormal    Past Surgical History:  Procedure Laterality Date  . LEEP    . WISDOM TOOTH EXTRACTION     Family History  Problem Relation Age of Onset  . Hodgkin's lymphoma Mother   . Cancer Father   . Hypertension Maternal Grandmother   . Cancer Paternal Grandfather        oral    Current Outpatient Medications:  .  docusate sodium (COLACE) 100 MG capsule, Take 1 capsule (100 mg total) by mouth 2 (two) times daily as needed., Disp: 30 capsule, Rfl: 2 .  ferrous sulfate 325 (65 FE) MG tablet, Take 325 mg by mouth daily with breakfast., Disp: , Rfl:  .  omeprazole (PRILOSEC) 20 MG capsule, Take 1 capsule (20 mg total) by mouth daily., Disp: 30 capsule, Rfl: 3 .  ondansetron (ZOFRAN) 4 MG tablet, TAKE 1 TABLET BY MOUTH EVERY 8 HOURS AS NEEDED FOR NAUSEA AND VOMITING, Disp: 16 tablet, Rfl: 2 .  Prenatal Vit-Fe Fumarate-FA (PRENATAL MULTIVITAMIN) TABS tablet, Take 1 tablet by mouth daily at 12 noon., Disp: , Rfl:  Social History: Reviewed -   reports that she has been smoking cigarettes. She has a 9.00 pack-year smoking history. She has never used smokeless tobacco.  Physical Assessment:   Vitals:   12/11/18 1448  BP: (!) 144/96  Pulse: (!) 111  Weight: 201 lb (91.2 kg)  Body mass index is 37.98 kg/m.        Physical Examination:   General appearance: Well appearing, and in no distress  Mental status: Alert, oriented to person, place, and time  Skin: Warm & dry  Cardiovascular: Normal heart rate noted  Respiratory: Normal respiratory effort, no distress  Abdomen: Soft, gravid, nontender  Pelvic: Cervical exam performed         Extremities: Edema: None  Fetal Status:     Movement: Present    No results found for this or any previous visit (from the past 24 hour(s)).  Assessment & Plan:  1) Low-risk pregnancy G1P0 at [redacted]w[redacted]d with an Estimated Date of Delivery: 12/28/18   2) GHTN,    Labs/procedures/US today:   Plan:  To [facility] for IOL.  Discussed w/Dr. Annia Friendly.    Follow-up: Return in about 9 days (around 12/20/2018) for OB Mychart visit w/RN for BP check.  Orders Placed This Encounter  Procedures  . GC/Chlamydia Probe Amp   Christin Fudge CNM 12/11/2018 4:22 PM

## 2018-12-12 ENCOUNTER — Inpatient Hospital Stay (HOSPITAL_COMMUNITY): Payer: Medicaid Other | Admitting: Anesthesiology

## 2018-12-12 ENCOUNTER — Encounter (HOSPITAL_COMMUNITY): Payer: Self-pay | Admitting: *Deleted

## 2018-12-12 LAB — CBC
HCT: 36.7 % (ref 36.0–46.0)
HCT: 32.5 % — ABNORMAL LOW (ref 36.0–46.0)
Hemoglobin: 11.9 g/dL — ABNORMAL LOW (ref 12.0–15.0)
Hemoglobin: 11.1 g/dL — ABNORMAL LOW (ref 12.0–15.0)
MCH: 32.4 pg (ref 26.0–34.0)
MCH: 32.3 pg (ref 26.0–34.0)
MCHC: 32.4 g/dL (ref 30.0–36.0)
MCHC: 34.2 g/dL (ref 30.0–36.0)
MCV: 100 fL (ref 80.0–100.0)
MCV: 94.5 fL (ref 80.0–100.0)
Platelets: 153 10*3/uL (ref 150–400)
Platelets: 140 10*3/uL — ABNORMAL LOW (ref 150–400)
RBC: 3.67 MIL/uL — ABNORMAL LOW (ref 3.87–5.11)
RBC: 3.44 MIL/uL — ABNORMAL LOW (ref 3.87–5.11)
RDW: 13.2 % (ref 11.5–15.5)
RDW: 13.2 % (ref 11.5–15.5)
WBC: 12.9 10*3/uL — ABNORMAL HIGH (ref 4.0–10.5)
WBC: 12.8 10*3/uL — ABNORMAL HIGH (ref 4.0–10.5)
nRBC: 0 % (ref 0.0–0.2)
nRBC: 0 % (ref 0.0–0.2)

## 2018-12-12 LAB — GROUP B STREP BY PCR: Group B strep by PCR: POSITIVE — AB

## 2018-12-12 LAB — RPR: RPR Ser Ql: NONREACTIVE

## 2018-12-12 LAB — PROTEIN / CREATININE RATIO, URINE
Creatinine, Urine: 68.74 mg/dL
Protein Creatinine Ratio: 0.13 mg/mg{Cre} (ref 0.00–0.15)
Total Protein, Urine: 9 mg/dL

## 2018-12-12 MED ORDER — DIPHENHYDRAMINE HCL 50 MG/ML IJ SOLN
12.5000 mg | INTRAMUSCULAR | Status: DC | PRN
  Administered 2018-12-13: 12.5 mg via INTRAVENOUS
  Filled 2018-12-12: qty 1

## 2018-12-12 MED ORDER — BUTORPHANOL TARTRATE 1 MG/ML IJ SOLN
1.0000 mg | Freq: Once | INTRAMUSCULAR | Status: AC
  Administered 2018-12-12: 1 mg via INTRAVENOUS
  Filled 2018-12-12: qty 1

## 2018-12-12 MED ORDER — MISOPROSTOL 50MCG HALF TABLET
ORAL_TABLET | ORAL | Status: AC
  Administered 2018-12-12: 50 ug
  Filled 2018-12-12: qty 1

## 2018-12-12 MED ORDER — EPHEDRINE 5 MG/ML INJ: 10.0000 mg | INTRAVENOUS | Status: DC | PRN

## 2018-12-12 MED ORDER — FENTANYL-BUPIVACAINE-NACL 0.5-0.125-0.9 MG/250ML-% EP SOLN
12.0000 mL/h | EPIDURAL | Status: DC | PRN
  Administered 2018-12-13 – 2018-12-14 (×2): 12 mL/h via EPIDURAL
  Filled 2018-12-12 (×3): qty 250

## 2018-12-12 MED ORDER — OXYTOCIN 40 UNITS IN NORMAL SALINE INFUSION - SIMPLE MED
1.0000 m[IU]/min | INTRAVENOUS | Status: DC
  Administered 2018-12-12 – 2018-12-13 (×2): 2 m[IU]/min via INTRAVENOUS
  Filled 2018-12-12: qty 1000

## 2018-12-12 MED ORDER — PHENYLEPHRINE 40 MCG/ML (10ML) SYRINGE FOR IV PUSH (FOR BLOOD PRESSURE SUPPORT): 80.0000 ug | PREFILLED_SYRINGE | INTRAVENOUS | Status: DC | PRN

## 2018-12-12 MED ORDER — SODIUM CHLORIDE 0.9 % IV SOLN
5.0000 10*6.[IU] | Freq: Once | INTRAVENOUS | Status: AC
  Administered 2018-12-12: 5 10*6.[IU] via INTRAVENOUS
  Filled 2018-12-12: qty 5

## 2018-12-12 MED ORDER — LACTATED RINGERS IV SOLN
500.0000 mL | Freq: Once | INTRAVENOUS | Status: AC
  Administered 2018-12-12: 500 mL via INTRAVENOUS

## 2018-12-12 MED ORDER — PENICILLIN G 3 MILLION UNITS IVPB - SIMPLE MED
3.0000 10*6.[IU] | INTRAVENOUS | Status: DC
  Administered 2018-12-12 – 2018-12-14 (×15): 3 10*6.[IU] via INTRAVENOUS
  Filled 2018-12-12 (×14): qty 100

## 2018-12-12 MED ORDER — PHENYLEPHRINE 40 MCG/ML (10ML) SYRINGE FOR IV PUSH (FOR BLOOD PRESSURE SUPPORT)
80.0000 ug | PREFILLED_SYRINGE | INTRAVENOUS | Status: DC | PRN
  Filled 2018-12-12: qty 10

## 2018-12-12 MED ORDER — TERBUTALINE SULFATE 1 MG/ML IJ SOLN: 0.2500 mg | Freq: Once | INTRAMUSCULAR | Status: DC | PRN

## 2018-12-12 MED ORDER — MISOPROSTOL 50MCG HALF TABLET: 50.0000 ug | ORAL_TABLET | ORAL | Status: DC | PRN

## 2018-12-12 NOTE — Progress Notes (Signed)
Patient Vitals for the past 4 hrs:  BP Temp Temp src Pulse Resp  12/12/18 2309 (!) 140/92 - - 95 -  12/12/18 2201 120/79 - - 83 16  12/12/18 2136 121/61 - - 87 -  12/12/18 2131 129/73 97.7 F (36.5 C) Oral 89 16  12/12/18 2032 (!) 129/44 - - 65 16  12/12/18 2021 134/76 - - 87 16   Just got epidural.  Ctx q 2-3 minutes, pit not restarted d/t ctx ^ in frequency/intensity.  FHR Cat 1.  cx 5/50/-2.  If ctx slow, will restart pitocin.

## 2018-12-12 NOTE — Progress Notes (Addendum)
Labor Progress Note Lori Barnes is a 28 y.o. G1P0 at [redacted]w[redacted]d presented for IOL secondary to gHTN. S: Feeling right-sided abdominal pain and would like something else other than Fentanyl for pain.   O:  BP (!) 153/85   Pulse 85   Temp 98.1 F (36.7 C) (Oral)   Resp 16   Ht 5\' 1"  (1.549 m)   Wt 91.7 kg   LMP 03/23/2018 (Approximate)   BMI 38.21 kg/m  EFM: 145, reactive, moderate variability, pos accels and no decels Ctx: q23m   CVE: Dilation: Fingertip(LEEP scar tissue broken up) Effacement (%): Thick Cervical Position: Posterior Station: -3 Presentation: Vertex Exam by:: Dr. Marice Potter   [name]: 28 y.o. G1P0 [redacted]w[redacted]d here for IOL secondary to gHTN. #Labor: S/p 4 Cytotec, FB placed at 0930 after lysis of scar tissue. Will start Pitocin now. FB still in place. Could stop Pit later should patient want to eat and shower.  #Pain: Will trial Stadol now. Epidural as desires when FB falls out. #FWB: Cat I, EFW 3500g #GBS positive. PCN since 0101 #gHTN: Serial BP's. Pr/Cr ration 0.13. CMP WNL. Asymptomatic. Normal to mild range BP's thus far.  BP Readings from Last 3 Encounters:  12/12/18 (!) 153/85  12/11/18 (!) 144/96  11/21/18 137/88    [name], MD 1:43 PM

## 2018-12-12 NOTE — Progress Notes (Signed)
Labor Progress Note  Subjective: Pt reports feeling cramping and abdominal pain that started 15 mins after second dose of cytotec. Rates 8/10. Pt is tearful and requesting pain medication.  Objective: BP 136/79   Pulse 89   Temp 97.8 F (36.6 C) (Oral)   Resp 18   Ht 5\' 1"  (1.549 m)   Wt 91.7 kg   LMP 03/23/2018 (Approximate)   BMI 38.21 kg/m   Dilation: Closed Effacement (%): Thick Station: -3 Presentation: Vertex Exam by:: Shela Nevin, RN   Assessment and Plan: 28 y.o. G1P0 [redacted]w[redacted]d admitted for induction of labor for gHTN.   Labor:  -- Cytotec x 2 doses. Will check patient around 0500 with possible foley bulb insertion -- GBS positive. PCN x1 dose given -- Pre-e labs WNL. Protein/Creatinine Ratio pending.  -- Pain control: IV pain medication -- PPH Risk: medium -- Plan SVD  Fetal Well-Being:  -- Cephalic by RN VE  -- Category 1; FHR 130, moderate variability, +accels, no decels  -- Continuous fetal monitoring    Maryagnes Amos, SNM 1:50 AM

## 2018-12-12 NOTE — Anesthesia Preprocedure Evaluation (Signed)
Anesthesia Evaluation  Patient identified by MRN, date of birth, ID band Patient awake    Reviewed: Allergy & Precautions, NPO status , Patient's Chart, lab work & pertinent test results  Airway Mallampati: II  TM Distance: >3 FB Neck ROM: Full    Dental  (+) Dental Advisory Given   Pulmonary neg pulmonary ROS, Current Smoker,    Pulmonary exam normal breath sounds clear to auscultation       Cardiovascular hypertension, Normal cardiovascular exam Rhythm:Regular Rate:Normal     Neuro/Psych negative neurological ROS  negative psych ROS   GI/Hepatic negative GI ROS, Neg liver ROS,   Endo/Other  negative endocrine ROS  Renal/GU negative Renal ROS     Musculoskeletal negative musculoskeletal ROS (+)   Abdominal (+) + obese,   Peds  Hematology negative hematology ROS (+)   Anesthesia Other Findings   Reproductive/Obstetrics (+) Pregnancy                             Anesthesia Physical Anesthesia Plan  ASA: II  Anesthesia Plan: Epidural   Post-op Pain Management:    Induction:   PONV Risk Score and Plan:   Airway Management Planned:   Additional Equipment:   Intra-op Plan:   Post-operative Plan:   Informed Consent: I have reviewed the patients History and Physical, chart, labs and discussed the procedure including the risks, benefits and alternatives for the proposed anesthesia with the patient or authorized representative who has indicated his/her understanding and acceptance.       Plan Discussed with:   Anesthesia Plan Comments:         Anesthesia Quick Evaluation

## 2018-12-12 NOTE — Progress Notes (Signed)
[facility]: Labor Progress Note  Subjective: Pt reports mild cramping, Has been able to get some rest throughout the night  Objective: BP (!) 140/94   Pulse 93   Temp 97.8 F (36.6 C) (Oral)   Resp 15   Ht 5\' 1"  (1.549 m)   Wt 91.7 kg   LMP 03/23/2018 (Approximate)   BMI 38.21 kg/m   Dilation: Closed Effacement (%): Thick Station: -3 Presentation: Vertex Exam by:: Shela Nevin, RN   Assessment and Plan: 28 y.o. G1P0 [redacted]w[redacted]d admitted for IOL for gHTN.  Labor:  -- Cytotex x3 doses. Unable to place foley bulb. Will attempt with next cytotec placement -- GBS positive; PCN x3 doses -- Protein creatinine ratio pending. All other pre-e labs WNL -- Pain control: IV pain medication; planning epidural -- PPH Risk: low  Fetal Well-Being:  -- Cephalic by VE -- Category 1; FHR 135, moderate variability, +accels, no decels   Maryagnes Amos, SNM 5:10 AM

## 2018-12-12 NOTE — Progress Notes (Signed)
Labor Progress Note Lori Barnes is a 28 y.o. G1P0 at [redacted]w[redacted]d presented for IOL secondary to gHTN. S: Received Fentanyl about 15 minutes ago. Feeling a little more comfortable. She is hungry.   O:  BP 124/69 (BP Location: Right Arm)   Pulse 67   Temp (!) 97.5 F (36.4 C) (Oral)   Resp 20   Ht 5\' 1"  (1.549 m)   Wt 91.7 kg   LMP 03/23/2018 (Approximate)   BMI 38.21 kg/m  EFM: 145, reactive, moderate variability, pos accels and no decels Ctx: q73m (difficult to monitor currently)  CVE: Dilation: 4.5 Effacement (%): 60 Cervical Position: Middle Station: -3, -2 Presentation: Vertex Exam by:: Curly [place], RN   [name]: 28 y.o. G1P0 [redacted]w[redacted]d here for IOL secondary to gHTN. #Labor: S/p 4 Cytotec, FB placed at 0930 after lysis of scar tissue; now fell out. Pit since 1400. Pit turned off now; watch on monitor for 20 more minutes. At 2000, patient can have regular diet and shower. Restart Pit at 2 when patient is ready.  #Pain: IV pain meds. Epidural when desires.  #FWB: Cat I, EFW 3500g #GBS positive. PCN since 0101 #gHTN: Serial BP's. Pr/Cr ration 0.13. CMP WNL. Asymptomatic. Normal to mild range BP's thus far.  BP Readings from Last 3 Encounters:  12/12/18 124/69  12/11/18 (!) 144/96  11/21/18 137/88    [name], MD 7:40 PM

## 2018-12-13 MED ORDER — SODIUM CHLORIDE (PF) 0.9 % IJ SOLN
INTRAMUSCULAR | Status: DC | PRN
  Administered 2018-12-13: 12 mL/h via EPIDURAL

## 2018-12-13 MED ORDER — LIDOCAINE HCL (PF) 1 % IJ SOLN
INTRAMUSCULAR | Status: DC | PRN
  Administered 2018-12-12 (×2): 4 mL via EPIDURAL

## 2018-12-13 MED ORDER — TERBUTALINE SULFATE 1 MG/ML IJ SOLN: 0.2500 mg | Freq: Once | INTRAMUSCULAR | Status: DC | PRN

## 2018-12-13 MED ORDER — OXYTOCIN 40 UNITS IN NORMAL SALINE INFUSION - SIMPLE MED
1.0000 m[IU]/min | INTRAVENOUS | Status: DC
  Administered 2018-12-13: 10 m[IU]/min via INTRAVENOUS
  Administered 2018-12-13: 8 m[IU]/min via INTRAVENOUS
  Administered 2018-12-14: 30 m[IU]/min via INTRAVENOUS
  Administered 2018-12-14: 20 m[IU]/min via INTRAVENOUS
  Filled 2018-12-13: qty 1000

## 2018-12-13 NOTE — Progress Notes (Addendum)
Labor Progress Note Lori Barnes is a 28 y.o. G1P0 at [redacted]w[redacted]d presented for IOL secondary to gHTN. S: Fairly comfortable. She would like a cutoff/timeline for labor course.   O:  BP 140/86   Pulse 81   Temp 98.3 F (36.8 C) (Oral)   Resp 18   Ht 5\' 1"  (1.549 m)   Wt 91.7 kg   LMP 03/23/2018 (Approximate)   SpO2 100%   BMI 38.21 kg/m  EFM: 145, reactive, moderate variability, pos accels and no decels Ctx: q3-33m  CVE: Dilation: 5 Effacement (%): 70 Cervical Position: Middle Station: -2 Presentation: Vertex Exam by:: dr. Marice Potter    [name]: 28 y.o. G1P0 [redacted]w[redacted]d here for IOL secondary to gHTN. #Labor: S/p 4 Cytotec and FB. S/p AROM at 1235. Cervix feels more effaced this exam therefore I don't think turning off Pitocin and trying another cytotec would provide much benefit. Cont Pit titration, MVU's ranging ~ 140-210 (only to 210 briefly). No definitive scar tissue of cervix appreciated. Will discuss timeline with oncoming team.  #Pain: Epidural  #FWB: Cat I, EFW 3500g #GBS positive. PCN since 0101 #gHTN: Serial BP's. Pr/Cr ration 0.13. CMP WNL. Asymptomatic. Normal to mild range BP's thus far.  BP Readings from Last 3 Encounters:  12/13/18 140/86  12/11/18 (!) 144/96  11/21/18 137/88    [name], MD 7:36 PM

## 2018-12-13 NOTE — Progress Notes (Addendum)
Lori Barnes is a 28 y.o. G1P0 at [redacted]w[redacted]d presented for IOL secondary to gHTN.  Subjective: Comfortable. Explained that we would turn off the Pitocin for 2 hrs to allow her uterus to "rest" and restart at half the dose she has been on. If she continues to make cervical change throughout the night then we can aim for SVD but if there hasn't been cervical change by approx 5am we should consider a c-section at that stage. Pt agreed with this plan.   Objective: BP (!) 146/82   Pulse 88   Temp 98.3 F (36.8 C) (Oral)   Resp 18   Ht 5\' 1"  (1.549 m)   Wt 91.7 kg   LMP 03/23/2018 (Approximate)   SpO2 100%   BMI 38.21 kg/m  I/O last 3 completed shifts: In: -  Out: 550 [Urine:550] No intake/output data recorded.  FHT:  FHR: 130 bpm, variability: moderate,  accelerations:  Present,  decelerations:  Absent UC:   regular, every 2-4 minutes, MVUs 120 SVE:   Dilation: 5 Effacement (%): 70 Station: -2 Exam by:: dr. Marice Potter   Labs: Lab Results  Component Value Date   WBC 12.8 (H) 12/12/2018   HGB 11.1 (L) 12/12/2018   HCT 32.5 (L) 12/12/2018   MCV 94.5 12/12/2018   PLT 140 (L) 12/12/2018    Assessment / Plan: Lori Barnes is a 28 y.o. G1P0 at [redacted]w[redacted]d by ultrasound admitted for induction of labor due to Gestational diabetes and Hypertension.  Labor: s/p cytotec and FB, s/p AROM at 12:35. Pitocin break as per Dr Kennon Rounds for 2 hrs. Restart Pitocin at 8. gHTN:  Bps fluctuating, recent 146/82, 113/58. Continue to monitor closely. Fetal Wellbeing:  Category I fetal tracing reassuring  Pain Control:  Epidural I/D:  GBS positive, on PCN Anticipated MOD:  NSVD anticipated. Consideration for c-section if cannot make adequate cervical change by approx 5am.  [name] MD PGY-1, [facility]  12/13/2018, 8:51 PM

## 2018-12-13 NOTE — Progress Notes (Signed)
Patient Vitals for the past 4 hrs:  BP Temp Pulse Resp  12/13/18 0631 (!) 111/58 98 F (36.7 C) 73 14  12/13/18 0601 108/67 - 85 -  12/13/18 0531 120/77 - 91 -  12/13/18 0501 118/72 - 90 16  12/13/18 0446 119/72 - 90 -  12/13/18 0401 132/74 - 91 14  12/13/18 0331 121/80 - 88 14  12/13/18 0301 129/82 - 89 14   Pit restarted around 0400 d/t ctx spacing out, now on 6 mu/min. Ctx q 2-5 minutes. cx no change, 4-5/thick/-3.  FHR Cat 1.  Pt comfortable w/epidural. Increase pitocin until labor adequate.

## 2018-12-13 NOTE — Anesthesia Procedure Notes (Signed)
Epidural Patient location during procedure: OB  Staffing Anesthesiologist: Nolon Nations, MD Performed: anesthesiologist   Preanesthetic Checklist Completed: patient identified, pre-op evaluation, timeout performed, IV checked, risks and benefits discussed and monitors and equipment checked  Epidural Patient position: sitting Prep: site prepped and draped and DuraPrep Patient monitoring: heart rate, continuous pulse ox and blood pressure Approach: midline Location: L2-L3 Injection technique: LOR air and LOR saline  Needle:  Needle type: Tuohy  Needle gauge: 17 G Needle length: 9 cm Needle insertion depth: 8 cm Catheter type: closed end flexible Catheter size: 19 Gauge Catheter at skin depth: 13 cm Test dose: negative  Assessment Sensory level: T8 Events: blood not aspirated, injection not painful, no injection resistance, negative IV test and no paresthesia  Additional Notes Reason for block:procedure for pain

## 2018-12-13 NOTE — Progress Notes (Addendum)
Lori Barnes is a 28 y.o. G1P0 at [redacted]w[redacted]d presented for IOL secondary to gHTN.  Subjective: Comfortable.  Objective: BP (!) 143/76   Pulse (!) 106   Temp 98.3 F (36.8 C) (Oral)   Resp 16   Ht 5\' 1"  (1.549 m)   Wt 91.7 kg   LMP 03/23/2018 (Approximate)   SpO2 100%   BMI 38.21 kg/m  I/O last 3 completed shifts: In: -  Out: 550 [Urine:550] No intake/output data recorded.  FHT:  FHR: 135 bpm, variability: moderate,  accelerations:  Present,  decelerations:  Absent UC:   Irregular, just started on pitocin  SVE:   Dilation: 5 Effacement (%): 70 Station: -2 Exam by:: dr. Marice Potter   Labs: Lab Results  Component Value Date   WBC 12.8 (H) 12/12/2018   HGB 11.1 (L) 12/12/2018   HCT 32.5 (L) 12/12/2018   MCV 94.5 12/12/2018   PLT 140 (L) 12/12/2018    Assessment / Plan: Lori Barnes is a 28 y.o. G1P0 at [redacted]w[redacted]d by ultrasound admitted for induction of labor due to Gestational diabetes and Hypertension.  Labor: s/p cytotec and FB, s/p AROM at 12:35. Has had pitocin break for 2 hrs. Bedside US confirmed by myself. Will restart Pitocin at 8.  Fetal Wellbeing:  Category I fetal tracing reassuring  Pain Control:  Epidural I/D:  gbs positive Anticipated MOD:  NSVD anticipated. Consideration for c-section if cannot make adequate cervical change by approx 5am.  [name] MD PGY-1, [facility]  12/13/2018, 10:58 PM

## 2018-12-13 NOTE — Progress Notes (Signed)
Labor Progress Note PAMALA HAYMAN is a 28 y.o. G1P0 at [redacted]w[redacted]d presented for IOL secondary to gHTN. S: Comfortable with epidural.   O:  BP 123/64   Pulse 81   Temp 97.8 F (36.6 C) (Oral)   Resp 20   Ht 5\' 1"  (1.549 m)   Wt 91.7 kg   LMP 03/23/2018 (Approximate)   SpO2 100%   BMI 38.21 kg/m  EFM: 145, reactive, moderate variability, pos accels and no decels Ctx: q42m  CVE: Dilation: 4.5 Effacement (%): 60 Cervical Position: Middle Station: -2, -3 Presentation: Vertex Exam by:: rzhang,rnc-ob   [name]: 28 y.o. G1P0 [redacted]w[redacted]d here for IOL secondary to gHTN. #Labor: S/p 4 Cytotec and FB. Currently on Pit at 92. AROM with clear fluid and IUPC placed (posteriorly) at this check to better monitor/augment ctx.  #Pain: Epidural  #FWB: Cat I, EFW 3500g #GBS positive. PCN since 0101 #gHTN: Serial BP's. Pr/Cr ration 0.13. CMP WNL. Asymptomatic. Normal to mild range BP's thus far.  BP Readings from Last 3 Encounters:  12/13/18 123/64  12/11/18 (!) 144/96  11/21/18 137/88    [name], MD 12:44 PM

## 2018-12-14 ENCOUNTER — Encounter (HOSPITAL_COMMUNITY)
Admission: AD | Disposition: A | Discharge status: Home / Self Care | Payer: Self-pay | Source: Home / Self Care | Attending: Obstetrics & Gynecology

## 2018-12-14 ENCOUNTER — Encounter (HOSPITAL_COMMUNITY): Payer: Self-pay

## 2018-12-14 DIAGNOSIS — Z98891 History of uterine scar from previous surgery: Secondary | ICD-10-CM

## 2018-12-14 DIAGNOSIS — Z3A38 38 weeks gestation of pregnancy: Secondary | ICD-10-CM

## 2018-12-14 DIAGNOSIS — O324XX0 Maternal care for high head at term, not applicable or unspecified: Secondary | ICD-10-CM

## 2018-12-14 DIAGNOSIS — O134 Gestational [pregnancy-induced] hypertension without significant proteinuria, complicating childbirth: Secondary | ICD-10-CM

## 2018-12-14 SURGERY — Surgical Case
Anesthesia: Epidural

## 2018-12-14 MED ORDER — FENTANYL CITRATE (PF) 100 MCG/2ML IJ SOLN
INTRAMUSCULAR | Status: AC
  Filled 2018-12-14: qty 2

## 2018-12-14 MED ORDER — MIDAZOLAM HCL 5 MG/5ML IJ SOLN
INTRAMUSCULAR | Status: DC | PRN
  Administered 2018-12-14: 2 mg via INTRAVENOUS

## 2018-12-14 MED ORDER — SIMETHICONE 80 MG PO CHEW
80.0000 mg | CHEWABLE_TABLET | ORAL | Status: DC | PRN
  Administered 2018-12-15: 80 mg via ORAL
  Filled 2018-12-14: qty 1

## 2018-12-14 MED ORDER — DIPHENHYDRAMINE HCL 50 MG/ML IJ SOLN: 12.5000 mg | INTRAMUSCULAR | Status: DC | PRN

## 2018-12-14 MED ORDER — ONDANSETRON HCL 4 MG/2ML IJ SOLN
INTRAMUSCULAR | Status: DC | PRN
  Administered 2018-12-14: 4 mg via INTRAVENOUS

## 2018-12-14 MED ORDER — SCOPOLAMINE 1 MG/3DAYS TD PT72: 1.0000 | MEDICATED_PATCH | Freq: Once | TRANSDERMAL | Status: DC

## 2018-12-14 MED ORDER — TRAMADOL HCL 50 MG PO TABS: 50.0000 mg | ORAL_TABLET | Freq: Four times a day (QID) | ORAL | Status: DC | PRN

## 2018-12-14 MED ORDER — FERROUS SULFATE 325 (65 FE) MG PO TABS
325.0000 mg | ORAL_TABLET | Freq: Two times a day (BID) | ORAL | Status: DC
  Administered 2018-12-15 – 2018-12-16 (×3): 325 mg via ORAL
  Filled 2018-12-14 (×3): qty 1

## 2018-12-14 MED ORDER — PROMETHAZINE HCL 25 MG/ML IJ SOLN: 6.2500 mg | INTRAMUSCULAR | Status: DC | PRN

## 2018-12-14 MED ORDER — SODIUM CHLORIDE 0.9 % IV SOLN
INTRAVENOUS | Status: DC | PRN
  Administered 2018-12-14: 40 [IU] via INTRAVENOUS

## 2018-12-14 MED ORDER — OXYCODONE HCL 5 MG PO TABS
5.0000 mg | ORAL_TABLET | ORAL | Status: DC | PRN
  Administered 2018-12-15: 10 mg via ORAL
  Administered 2018-12-15: 5 mg via ORAL
  Administered 2018-12-15 – 2018-12-16 (×3): 10 mg via ORAL
  Filled 2018-12-14 (×3): qty 2
  Filled 2018-12-14: qty 1
  Filled 2018-12-14: qty 2

## 2018-12-14 MED ORDER — SODIUM CHLORIDE 0.9 % IV SOLN
500.0000 mg | INTRAVENOUS | Status: AC
  Administered 2018-12-14: 500 mg via INTRAVENOUS
  Filled 2018-12-14: qty 500

## 2018-12-14 MED ORDER — ALBUMIN HUMAN 5 % IV SOLN
INTRAVENOUS | Status: DC | PRN
  Administered 2018-12-14: 18:00:00 via INTRAVENOUS

## 2018-12-14 MED ORDER — DEXAMETHASONE SODIUM PHOSPHATE 10 MG/ML IJ SOLN
INTRAMUSCULAR | Status: AC
  Filled 2018-12-14: qty 1

## 2018-12-14 MED ORDER — KETOROLAC TROMETHAMINE 30 MG/ML IJ SOLN
INTRAMUSCULAR | Status: AC
  Filled 2018-12-14: qty 1

## 2018-12-14 MED ORDER — CEFAZOLIN SODIUM-DEXTROSE 2-4 GM/100ML-% IV SOLN
INTRAVENOUS | Status: AC
  Filled 2018-12-14: qty 100

## 2018-12-14 MED ORDER — MIDAZOLAM HCL 2 MG/2ML IJ SOLN
INTRAMUSCULAR | Status: AC
  Filled 2018-12-14: qty 2

## 2018-12-14 MED ORDER — MAGNESIUM HYDROXIDE 400 MG/5ML PO SUSP: 30.0000 mL | ORAL | Status: DC | PRN

## 2018-12-14 MED ORDER — HYDROMORPHONE HCL 1 MG/ML IJ SOLN
0.2500 mg | INTRAMUSCULAR | Status: DC | PRN
  Administered 2018-12-14: 0.5 mg via INTRAVENOUS

## 2018-12-14 MED ORDER — DIPHENHYDRAMINE HCL 25 MG PO CAPS: 25.0000 mg | ORAL_CAPSULE | Freq: Four times a day (QID) | ORAL | Status: DC | PRN

## 2018-12-14 MED ORDER — NALOXONE HCL 0.4 MG/ML IJ SOLN: 0.4000 mg | INTRAMUSCULAR | Status: DC | PRN

## 2018-12-14 MED ORDER — CARBOPROST TROMETHAMINE 250 MCG/ML IM SOLN
INTRAMUSCULAR | Status: DC | PRN
  Administered 2018-12-14: 250 ug via INTRAMUSCULAR

## 2018-12-14 MED ORDER — ZOLPIDEM TARTRATE 5 MG PO TABS: 5.0000 mg | ORAL_TABLET | Freq: Every evening | ORAL | Status: DC | PRN

## 2018-12-14 MED ORDER — HYDROMORPHONE HCL 1 MG/ML IJ SOLN
INTRAMUSCULAR | Status: AC
  Filled 2018-12-14: qty 0.5

## 2018-12-14 MED ORDER — NALBUPHINE SYRINGE 5 MG/0.5 ML
5.0000 mg | INJECTION | INTRAMUSCULAR | Status: DC | PRN
  Filled 2018-12-14: qty 0.5

## 2018-12-14 MED ORDER — LIDOCAINE-EPINEPHRINE (PF) 2 %-1:200000 IJ SOLN
INTRAMUSCULAR | Status: DC | PRN
  Administered 2018-12-14 (×2): 5 mL via EPIDURAL
  Administered 2018-12-14: 2 mL via EPIDURAL

## 2018-12-14 MED ORDER — COCONUT OIL OIL: 1.0000 "application " | TOPICAL_OIL | Status: DC | PRN

## 2018-12-14 MED ORDER — IBUPROFEN 800 MG PO TABS
800.0000 mg | ORAL_TABLET | Freq: Four times a day (QID) | ORAL | Status: DC
  Administered 2018-12-15 – 2018-12-16 (×3): 800 mg via ORAL
  Filled 2018-12-14 (×3): qty 1

## 2018-12-14 MED ORDER — DIPHENHYDRAMINE HCL 25 MG PO CAPS: 25.0000 mg | ORAL_CAPSULE | ORAL | Status: DC | PRN

## 2018-12-14 MED ORDER — SODIUM CHLORIDE 0.9 % IV SOLN
INTRAVENOUS | Status: DC | PRN
  Administered 2018-12-14: 17:00:00 via INTRAVENOUS

## 2018-12-14 MED ORDER — NALBUPHINE SYRINGE 5 MG/0.5 ML
5.0000 mg | INJECTION | Freq: Once | INTRAMUSCULAR | Status: DC | PRN
  Filled 2018-12-14: qty 0.5

## 2018-12-14 MED ORDER — OXYCODONE HCL 5 MG PO TABS: 5.0000 mg | ORAL_TABLET | Freq: Once | ORAL | Status: DC | PRN

## 2018-12-14 MED ORDER — SODIUM CHLORIDE 0.9% FLUSH: 3.0000 mL | INTRAVENOUS | Status: DC | PRN

## 2018-12-14 MED ORDER — EPHEDRINE 5 MG/ML INJ
INTRAVENOUS | Status: AC
  Filled 2018-12-14: qty 10

## 2018-12-14 MED ORDER — WITCH HAZEL-GLYCERIN EX PADS: 1.0000 "application " | MEDICATED_PAD | CUTANEOUS | Status: DC | PRN

## 2018-12-14 MED ORDER — HYDROMORPHONE HCL 1 MG/ML IJ SOLN: 1.0000 mg | INTRAMUSCULAR | Status: DC | PRN

## 2018-12-14 MED ORDER — MORPHINE SULFATE (PF) 0.5 MG/ML IJ SOLN
INTRAMUSCULAR | Status: DC | PRN
  Administered 2018-12-14: 2 mg via INTRAVENOUS
  Administered 2018-12-14: 3 mg via EPIDURAL

## 2018-12-14 MED ORDER — GABAPENTIN 100 MG PO CAPS
300.0000 mg | ORAL_CAPSULE | Freq: Two times a day (BID) | ORAL | Status: DC
  Administered 2018-12-15 (×3): 300 mg via ORAL
  Filled 2018-12-14 (×3): qty 3

## 2018-12-14 MED ORDER — MEASLES, MUMPS & RUBELLA VAC IJ SOLR: 0.5000 mL | Freq: Once | INTRAMUSCULAR | Status: DC

## 2018-12-14 MED ORDER — SIMETHICONE 80 MG PO CHEW
80.0000 mg | CHEWABLE_TABLET | ORAL | Status: DC
  Administered 2018-12-15 – 2018-12-16 (×2): 80 mg via ORAL
  Filled 2018-12-14 (×2): qty 1

## 2018-12-14 MED ORDER — FENTANYL CITRATE (PF) 100 MCG/2ML IJ SOLN
INTRAMUSCULAR | Status: DC | PRN
  Administered 2018-12-14: 100 ug via INTRAVENOUS

## 2018-12-14 MED ORDER — SODIUM CHLORIDE 0.9 % IV SOLN
INTRAVENOUS | Status: AC
  Filled 2018-12-14: qty 500

## 2018-12-14 MED ORDER — LABETALOL HCL 5 MG/ML IV SOLN: 40.0000 mg | INTRAVENOUS | Status: DC | PRN

## 2018-12-14 MED ORDER — DIBUCAINE (PERIANAL) 1 % EX OINT: 1.0000 "application " | TOPICAL_OINTMENT | CUTANEOUS | Status: DC | PRN

## 2018-12-14 MED ORDER — SCOPOLAMINE 1 MG/3DAYS TD PT72
MEDICATED_PATCH | TRANSDERMAL | Status: DC | PRN
  Administered 2018-12-14: 1 via TRANSDERMAL

## 2018-12-14 MED ORDER — MORPHINE SULFATE (PF) 0.5 MG/ML IJ SOLN
INTRAMUSCULAR | Status: AC
  Filled 2018-12-14: qty 10

## 2018-12-14 MED ORDER — HYDRALAZINE HCL 20 MG/ML IJ SOLN: 10.0000 mg | INTRAMUSCULAR | Status: DC | PRN

## 2018-12-14 MED ORDER — LABETALOL HCL 5 MG/ML IV SOLN: 20.0000 mg | INTRAVENOUS | Status: DC | PRN

## 2018-12-14 MED ORDER — LACTATED RINGERS IV SOLN
INTRAVENOUS | Status: DC
  Administered 2018-12-14: 125 mL/h via INTRAVENOUS
  Administered 2018-12-15: 06:00:00 via INTRAVENOUS

## 2018-12-14 MED ORDER — OXYTOCIN 40 UNITS IN NORMAL SALINE INFUSION - SIMPLE MED: 2.5000 [IU]/h | INTRAVENOUS | Status: AC

## 2018-12-14 MED ORDER — CALCIUM CARBONATE ANTACID 500 MG PO CHEW: 2.0000 | CHEWABLE_TABLET | Freq: Once | ORAL | Status: DC

## 2018-12-14 MED ORDER — ENOXAPARIN SODIUM 40 MG/0.4ML ~~LOC~~ SOLN
40.0000 mg | SUBCUTANEOUS | Status: DC
  Administered 2018-12-15: 40 mg via SUBCUTANEOUS
  Filled 2018-12-14: qty 0.4

## 2018-12-14 MED ORDER — ONDANSETRON HCL 4 MG/2ML IJ SOLN
INTRAMUSCULAR | Status: AC
  Filled 2018-12-14: qty 2

## 2018-12-14 MED ORDER — BUPIVACAINE HCL (PF) 0.5 % IJ SOLN
INTRAMUSCULAR | Status: AC
  Filled 2018-12-14: qty 30

## 2018-12-14 MED ORDER — SCOPOLAMINE 1 MG/3DAYS TD PT72
MEDICATED_PATCH | TRANSDERMAL | Status: AC
  Filled 2018-12-14: qty 1

## 2018-12-14 MED ORDER — DEXAMETHASONE SODIUM PHOSPHATE 10 MG/ML IJ SOLN
INTRAMUSCULAR | Status: DC | PRN
  Administered 2018-12-14: 10 mg via INTRAVENOUS

## 2018-12-14 MED ORDER — TRANEXAMIC ACID-NACL 1000-0.7 MG/100ML-% IV SOLN
1000.0000 mg | Freq: Once | INTRAVENOUS | Status: AC
  Administered 2018-12-14: 1000 mg via INTRAVENOUS
  Filled 2018-12-14: qty 100

## 2018-12-14 MED ORDER — MENTHOL 3 MG MT LOZG: 1.0000 | LOZENGE | OROMUCOSAL | Status: DC | PRN

## 2018-12-14 MED ORDER — TETANUS-DIPHTH-ACELL PERTUSSIS 5-2.5-18.5 LF-MCG/0.5 IM SUSP: 0.5000 mL | Freq: Once | INTRAMUSCULAR | Status: DC

## 2018-12-14 MED ORDER — KETOROLAC TROMETHAMINE 30 MG/ML IJ SOLN
30.0000 mg | Freq: Four times a day (QID) | INTRAMUSCULAR | Status: AC
  Administered 2018-12-15 (×3): 30 mg via INTRAVENOUS
  Filled 2018-12-14 (×3): qty 1

## 2018-12-14 MED ORDER — SENNOSIDES-DOCUSATE SODIUM 8.6-50 MG PO TABS
2.0000 | ORAL_TABLET | ORAL | Status: DC
  Administered 2018-12-15 – 2018-12-16 (×2): 2 via ORAL
  Filled 2018-12-14 (×2): qty 2

## 2018-12-14 MED ORDER — SODIUM CHLORIDE 0.9 % IV SOLN
INTRAVENOUS | Status: AC
  Filled 2018-12-14: qty 2

## 2018-12-14 MED ORDER — KETOROLAC TROMETHAMINE 30 MG/ML IJ SOLN
30.0000 mg | Freq: Once | INTRAMUSCULAR | Status: AC | PRN
  Administered 2018-12-14: 30 mg via INTRAVENOUS

## 2018-12-14 MED ORDER — KETOROLAC TROMETHAMINE 30 MG/ML IJ SOLN: 30.0000 mg | Freq: Once | INTRAMUSCULAR | Status: DC

## 2018-12-14 MED ORDER — ALBUMIN HUMAN 5 % IV SOLN
INTRAVENOUS | Status: AC
  Filled 2018-12-14: qty 250

## 2018-12-14 MED ORDER — SODIUM CHLORIDE 0.9 % IV SOLN
2.0000 g | INTRAVENOUS | Status: AC
  Administered 2018-12-14: 17:00:00 2 g via INTRAVENOUS
  Filled 2018-12-14: qty 2

## 2018-12-14 MED ORDER — EPHEDRINE SULFATE 50 MG/ML IJ SOLN
INTRAMUSCULAR | Status: DC | PRN
  Administered 2018-12-14 (×2): 10 mg via INTRAVENOUS

## 2018-12-14 MED ORDER — DIMETHICONE 1 % EX CREA
TOPICAL_CREAM | Freq: Two times a day (BID) | CUTANEOUS | Status: DC | PRN
  Administered 2018-12-14: 1 via TOPICAL
  Filled 2018-12-14: qty 113

## 2018-12-14 MED ORDER — LACTATED RINGERS IV SOLN
INTRAVENOUS | Status: DC | PRN
  Administered 2018-12-14: 17:00:00 via INTRAVENOUS

## 2018-12-14 MED ORDER — OXYCODONE HCL 5 MG/5ML PO SOLN: 5.0000 mg | Freq: Once | ORAL | Status: DC | PRN

## 2018-12-14 MED ORDER — HYDRALAZINE HCL 20 MG/ML IJ SOLN: 5.0000 mg | INTRAMUSCULAR | Status: DC | PRN

## 2018-12-14 MED ORDER — CARBOPROST TROMETHAMINE 250 MCG/ML IM SOLN
INTRAMUSCULAR | Status: AC
  Filled 2018-12-14: qty 1

## 2018-12-14 MED ORDER — NALOXONE HCL 4 MG/10ML IJ SOLN
1.0000 ug/kg/h | INTRAVENOUS | Status: DC | PRN
  Filled 2018-12-14: qty 5

## 2018-12-14 MED ORDER — PRENATAL MULTIVITAMIN CH
1.0000 | ORAL_TABLET | Freq: Every day | ORAL | Status: DC
  Administered 2018-12-15: 1 via ORAL
  Filled 2018-12-14: qty 1

## 2018-12-14 MED ORDER — OXYCODONE-ACETAMINOPHEN 5-325 MG PO TABS: 2.0000 | ORAL_TABLET | ORAL | Status: DC | PRN

## 2018-12-14 MED ORDER — ONDANSETRON HCL 4 MG/2ML IJ SOLN: 4.0000 mg | Freq: Three times a day (TID) | INTRAMUSCULAR | Status: DC | PRN

## 2018-12-14 MED ORDER — LIDOCAINE-EPINEPHRINE (PF) 2 %-1:200000 IJ SOLN
INTRAMUSCULAR | Status: AC
  Filled 2018-12-14: qty 20

## 2018-12-14 SURGICAL SUPPLY — 37 items
APL SKNCLS STERI-STRIP NONHPOA (GAUZE/BANDAGES/DRESSINGS) ×1
BENZOIN TINCTURE PRP APPL 2/3 (GAUZE/BANDAGES/DRESSINGS) ×2 IMPLANT
CHLORAPREP W/TINT 26ML (MISCELLANEOUS) ×3 IMPLANT
CLAMP CORD UMBIL (MISCELLANEOUS) IMPLANT
CLOSURE WOUND 1/2 X4 (GAUZE/BANDAGES/DRESSINGS) ×1
CLOTH BEACON ORANGE TIMEOUT ST (SAFETY) ×3 IMPLANT
DRSG OPSITE POSTOP 4X10 (GAUZE/BANDAGES/DRESSINGS) ×3 IMPLANT
ELECT REM PT RETURN 9FT ADLT (ELECTROSURGICAL) ×3
ELECTRODE REM PT RTRN 9FT ADLT (ELECTROSURGICAL) ×1 IMPLANT
EXTRACTOR VACUUM M CUP 4 TUBE (SUCTIONS) IMPLANT
EXTRACTOR VACUUM M CUP 4' TUBE (SUCTIONS)
GLOVE BIOGEL PI IND STRL 7.0 (GLOVE) ×3 IMPLANT
GLOVE BIOGEL PI INDICATOR 7.0 (GLOVE) ×6
GLOVE ECLIPSE 7.0 STRL STRAW (GLOVE) ×3 IMPLANT
GOWN STRL REUS W/TWL LRG LVL3 (GOWN DISPOSABLE) ×6 IMPLANT
KIT ABG SYR 3ML LUER SLIP (SYRINGE) IMPLANT
NDL HYPO 25X5/8 SAFETYGLIDE (NEEDLE) ×1 IMPLANT
NEEDLE HYPO 22GX1.5 SAFETY (NEEDLE) ×3 IMPLANT
NEEDLE HYPO 25X5/8 SAFETYGLIDE (NEEDLE) ×3 IMPLANT
NS IRRIG 1000ML POUR BTL (IV SOLUTION) ×3 IMPLANT
PACK C SECTION WH (CUSTOM PROCEDURE TRAY) ×3 IMPLANT
PAD ABD 7.5X8 STRL (GAUZE/BANDAGES/DRESSINGS) ×3 IMPLANT
PAD OB MATERNITY 4.3X12.25 (PERSONAL CARE ITEMS) ×3 IMPLANT
PENCIL SMOKE EVAC W/HOLSTER (ELECTROSURGICAL) ×3 IMPLANT
RETRACTOR TRAXI PANNICULUS (MISCELLANEOUS) IMPLANT
RTRCTR C-SECT PINK 25CM LRG (MISCELLANEOUS) IMPLANT
STRIP CLOSURE SKIN 1/2X4 (GAUZE/BANDAGES/DRESSINGS) ×1 IMPLANT
SUT PDS AB 0 CTX 36 PDP370T (SUTURE) IMPLANT
SUT PLAIN 2 0 XLH (SUTURE) IMPLANT
SUT VIC AB 0 CTX 36 (SUTURE) ×6
SUT VIC AB 0 CTX36XBRD ANBCTRL (SUTURE) ×2 IMPLANT
SUT VIC AB 4-0 KS 27 (SUTURE) ×3 IMPLANT
SYR CONTROL 10ML LL (SYRINGE) ×3 IMPLANT
TOWEL OR 17X24 6PK STRL BLUE (TOWEL DISPOSABLE) ×3 IMPLANT
TRAXI PANNICULUS RETRACTOR (MISCELLANEOUS) ×2
TRAY FOLEY W/BAG SLVR 14FR LF (SET/KITS/TRAYS/PACK) ×3 IMPLANT
WATER STERILE IRR 1000ML POUR (IV SOLUTION) ×3 IMPLANT

## 2018-12-14 NOTE — Transfer of Care (Signed)
Immediate Anesthesia Transfer of Care Note  Patient: Lori Barnes  Procedure(s) Performed: CESAREAN SECTION (N/A )  Patient Location: PACU  Anesthesia Type:Epidural  Level of Consciousness: awake, alert  and oriented  Airway & Oxygen Therapy: Patient Spontanous Breathing  Post-op Assessment: Report given to RN and Post -op Vital signs reviewed and stable  Post vital signs: Reviewed and stable  Last Vitals:  Vitals Value Taken Time  BP 148/95 12/14/18 1754  Temp    Pulse 111 12/14/18 1759  Resp 19 12/14/18 1759  SpO2 100 % 12/14/18 1759  Vitals shown include unvalidated device data.  Last Pain:  Vitals:   12/14/18 1601  TempSrc:   PainSc: 0-No pain         Complications: No apparent anesthesia complications

## 2018-12-14 NOTE — Progress Notes (Addendum)
1430 Blisters noted to patients right buttock. 5 noted. 1 1 cm noted in inner mid buttock. 1 1/4 by 6cm clear linear blister on right buttock. 1 linear 2 cm by 6cm linear clear blister on right buttock. 2 1cm clear individual blisters by rectum. Blisters noted when c Neil,cnm and rzhang,rnc-ob at bs to reposition pt for "dead bug" (lithotomy) baby turning position. Charge RN (Heather Mitchell,RN notified at 1440 and dr Harolyn Rutherford notified shortly after at 1500). Pt and FOB notified immediately.  1536 Dimethicone 1% creme applied to blisters on right buttock when arrived from pharmacy.  Stanley barrier applied to right sacrum upper and mid buttock. Creme removed with sterile guaze before applying barriers.

## 2018-12-14 NOTE — Progress Notes (Signed)
Labor Progress Note Lori Barnes is a 28 y.o. G1P0 at [redacted]w[redacted]d presented for IOL for gHTN  S:  Patient comfortable in bed  O:  BP (!) 154/95   Pulse 83   Temp 97.7 F (36.5 C) (Oral)   Resp 18   Ht 5\' 1"  (1.549 m)   Wt 91.7 kg   LMP 03/23/2018 (Approximate)   SpO2 100%   BMI 38.21 kg/m   Fetal Tracing: Baseline: 130 Variability: moderate Accels: 15x15 Decels: none Toco: 4-5   CVE: Dilation: 6(unchanged) Effacement (%): 90 Cervical Position: Middle Station: 0 Presentation: Vertex Exam by:: dr Harolyn Rutherford  Asynclitic presentation +caput +molding Patient also has linear blisters on right inferior gluteal area, no erythema noted.  Adequate MVUs for ~6 hours and no significant change  [name]: 28 y.o. G1P0 [redacted]w[redacted]d IOL gHTN with no significant cervical change despite several hours of adequate contractions. Category I FHR tracing. She has failure of cervical dilation. Cesarean delivery recommended, she agreed with this plan.  The risks of cesarean section discussed with the patient included but were not limited to: bleeding which may require transfusion or reoperation; infection which may require antibiotics; injury to bowel, bladder, ureters or other surrounding organs; injury to the fetus; need for additional procedures including hysterectomy in the event of a life-threatening hemorrhage; placental abnormalities wth subsequent pregnancies, incisional problems, thromboembolic phenomenon and other postoperative/anesthesia complications. The patient concurred with the proposed discussion, giving informed written consent for the procedure. Anesthesia and OR teams aware. Preoperative prophylactic antibiotics (Cefotetan and Azithomycin), TXA and SCDs ordered on call to the OR.  To OR when ready.  Of note, for patient's skin lesions, will apply dimethicone cream and apply barrier to it. Wound care nursing team has been notified.    Verita Schneiders, MD 3:51 PM

## 2018-12-14 NOTE — Progress Notes (Addendum)
Lori Barnes is a 28 y.o. G1P0 at [redacted]w[redacted]d presented for IOL secondary to gHTN.  Subjective: Comfortable, feeling some pressure  Objective: BP (!) 142/89   Pulse 92   Temp 98.2 F (36.8 C)   Resp 16   Ht 5\' 1"  (1.549 m)   Wt 91.7 kg   LMP 03/23/2018 (Approximate)   SpO2 100%   BMI 38.21 kg/m  I/O last 3 completed shifts: In: -  Out: 550 [Urine:550] No intake/output data recorded.  FHT:  FHR: 135 bpm, variability: moderate,  accelerations:  Present,  decelerations:  Absent UC:   regular, every 2-5 minutes, contractions not adequate yet SVE:   Dilation: 5 Effacement (%): 70 Station: -1 Exam by:: Dr Posey Pronto   Labs: Lab Results  Component Value Date   WBC 12.8 (H) 12/12/2018   HGB 11.1 (L) 12/12/2018   HCT 32.5 (L) 12/12/2018   MCV 94.5 12/12/2018   PLT 140 (L) 12/12/2018    Assessment / Plan: Lori Barnes a 28 y.o.G1P0 at [redacted]w[redacted]d by ultrasoundadmitted for induction of labor due toGestational diabetes and Hypertension.  Labor: s/p cytotec and FB, s/p AROM at 12:35. Has had pitocin break for 2 hrs. Pitocin restarted around 22:45. Pitocin now at 56. Discussed with Dr Kennon Rounds. Advised to keep increasing pitocin and reassess at 6am.  Fetal Wellbeing:  Category I reassuring tracing  Pain Control:  Epidural I/D:  gbs positive Anticipated MOD:  NSVD  Consideration for c-section if cannot make adequate cervical change by approx 5am.  Lori Haw MD PGY-1, [facility] 12/14/2018, 3:13 AM

## 2018-12-14 NOTE — Discharge Summary (Signed)
Postpartum Discharge Summary     Patient Name: Lori Barnes DOB: [birth year] MRN: [id number]  Date of admission: 12/11/2018 Delivering Provider: Verita Schneiders A   Date of discharge: 12/14/2018  Admitting diagnosis: Gest HTN, Induction  Intrauterine pregnancy: [redacted]w[redacted]d    Secondary diagnosis:  Principal Problem:   S/P cesarean section for AOD Active Problems:   S/P LEEP   Gestational hypertension   Tobacco use complicating pregnancy   Failed induction of labor  Additional problems: None     Discharge diagnosis: Term Pregnancy Delivered and Gestational Hypertension                                                                                                Post partum procedures: nexplanon   Augmentation: AROM, Pitocin, Cytotec and Foley Balloon  Complications: None  Hospital course:  Induction of Labor With Cesarean Section  28y.o. yo G1P0 at 370w0das admitted to the hospital 12/11/2018 for induction of labor. Patient presented with initial SVE closed/thick/hi. She received 4 Cytotec and cervix still closed. Scar tissue broken up with Kelly's and Foley bulb placed; patient started on Pitocin. She then received a Pitocin break and was restarted later that night with no cervical change despite AROM and IUPC placement. Patient had already received epidural. She received another Pitocin break and restarted with MVU's finally adequate around 0730 on 9/26 and adequate until decision made to go to C-section as patient without significant cervical change. The patient went for cesarean section due to Arrest of Dilation and Arrest of Descent, and delivered a Viable infant, Membrane Rupture Time/Date: 12:40 PM ,12/13/2018   Details of operation can be found in separate operative Note.  Patient had an uncomplicated postpartum course. BP's postpartum were improved & normal at time of discharge. She is ambulating, tolerating a regular diet, passing flatus, and urinating well.  Patient is  discharged home in stable condition on 12/14/18.                                   Delivery time: 1707   Magnesium Sulfate received: No BMZ received: No Rhophylac:No MMR:No Transfusion:No  Physical exam  Vitals:   12/15/18 1710 12/15/18 2052 12/15/18 2143 12/16/18 0612  BP: 115/64  116/61 129/81  Pulse: (!) 110  (!) 110 (!) 110  Resp: '16  20 20  ' Temp: 98 F (36.7 C)  98 F (36.7 C) 97.6 F (36.4 C)  TempSrc: Oral  Oral Oral  SpO2: 98% 98% 98% 98%  Weight:      Height:       General: alert, cooperative and no distress Lochia: appropriate Uterine Fundus: firm Incision: Healing well with no significant drainage, No significant erythema, Dressing is clean, dry, and intact DVT Evaluation: No evidence of DVT seen on physical exam. Labs: Lab Results  Component Value Date   WBC 12.8 (H) 12/12/2018   HGB 11.1 (L) 12/12/2018   HCT 32.5 (L) 12/12/2018   MCV 94.5 12/12/2018   PLT 140 (L) 12/12/2018   CMP  Latest Ref Rng & Units 12/11/2018  Glucose 70 - 99 mg/dL 93  BUN 6 - 20 mg/dL 5(L)  Creatinine 0.44 - 1.00 mg/dL 0.70  Sodium 135 - 145 mmol/L 134(L)  Potassium 3.5 - 5.1 mmol/L 3.5  Chloride 98 - 111 mmol/L 105  CO2 22 - 32 mmol/L 20(L)  Calcium 8.9 - 10.3 mg/dL 8.6(L)  Total Protein 6.5 - 8.1 g/dL 6.1(L)  Total Bilirubin 0.3 - 1.2 mg/dL 0.4  Alkaline Phos 38 - 126 U/L 129(H)  AST 15 - 41 U/L 22  ALT 0 - 44 U/L 13    Discharge instruction: per After Visit Summary and "Baby and Me Booklet".  After visit meds:  Allergies as of 12/16/2018      Reactions   Latex Other (See Comments)   Blister      Medication List    STOP taking these medications   docusate sodium 100 MG capsule Commonly known as: COLACE     TAKE these medications   albuterol 108 (90 Base) MCG/ACT inhaler Commonly known as: VENTOLIN HFA Inhale 2 puffs into the lungs every 4 (four) hours as needed for wheezing or shortness of breath.   ferrous sulfate 325 (65 FE) MG tablet Take 1 tablet (325  mg total) by mouth 2 (two) times daily with a meal. What changed: when to take this   ibuprofen 600 MG tablet Commonly known as: ADVIL Take 1 tablet (600 mg total) by mouth every 6 (six) hours as needed.   omeprazole 20 MG capsule Commonly known as: PriLOSEC Take 1 capsule (20 mg total) by mouth daily.   ondansetron 4 MG tablet Commonly known as: ZOFRAN TAKE 1 TABLET BY MOUTH EVERY 8 HOURS AS NEEDED FOR NAUSEA AND VOMITING What changed: See the new instructions.   oxyCODONE 5 MG immediate release tablet Commonly known as: Oxy IR/ROXICODONE Take 1-2 tablets (5-10 mg total) by mouth every 4 (four) hours as needed for moderate pain.   prenatal multivitamin Tabs tablet Take 1 tablet by mouth daily at 12 noon.       Diet: routine diet  Activity: Advance as tolerated. Pelvic rest for 6 weeks.   Outpatient follow up:2 weeks for incision check & 4-6 wks for pp visit Follow up Appt: Future Appointments  Date Time Provider [place]  12/18/2018  2:30 PM [facility] NURSE [facility]  02/07/2019  9:25 AM Terald Sleeper, PA-C [facility] None   Follow up Visit:   Please schedule this patient for Postpartum visit in: 4 weeks with the following provider: Any provider For C/S patients schedule nurse incision check in weeks 2 weeks: yes High risk pregnancy complicated by: gHTN Delivery mode:  CS Anticipated Birth Control:  Nexplanon PP Procedures needed: BP check and incision check.  Schedule Integrated BH visit: no      Newborn Data: Live born adult  Birth Weight: 6lb 10 oz APGAR (1 MIN): 7   APGAR (5 MINS): 9   APGAR (10 MINS):    Newborn Delivery   Birth date/time: 12/14/2018  1707 Delivery type: LTCS      Baby Feeding: Bottle Disposition:home with mother   12/16/2018 Jorje Guild, NP

## 2018-12-14 NOTE — Progress Notes (Signed)
Labor Progress Note Lori Barnes is a 28 y.o. G1P0 at [redacted]w[redacted]d presented for IOL for gHTN  S:  Patient comfortable in high fowlers  O:  BP 132/81   Pulse 76   Temp 97.7 F (36.5 C) (Oral)   Resp 18   Ht 5\' 1"  (1.549 m)   Wt 91.7 kg   LMP 03/23/2018 (Approximate)   SpO2 100%   BMI 38.21 kg/m   Fetal Tracing:  Baseline: 125 Variability: moderate Accels: 15x15 Decels: none  Toco: 2-3   CVE: Dilation: 6.5 Effacement (%): 100 Cervical Position: Middle Station: 0 Presentation: Vertex Exam by:: CNeil,cnm   [name]: 28 y.o. G1P0 [redacted]w[redacted]d IOL gHTN #Labor: Progressing well. Ridge of cervical scarring no longer felt. Side lying release on both sides and patient placed back on peanut. Will recheck in 30 minutes and attempt more position changes if needed #Pain: epidural #FWB: Cat 1 #GBS positive  Wende Mott, CNM 1:54 PM

## 2018-12-14 NOTE — Progress Notes (Addendum)
Left labia noted to be more swollen than left. Inner labia has white patch bilaterally. Dr Harolyn Rutherford notified. Discussed possible latex allergy as possibility. OR notified for latex allergy. Foley removed per provider approval. Latex free foley placed.

## 2018-12-14 NOTE — Anesthesia Postprocedure Evaluation (Signed)
Anesthesia Post Note  Patient: Khamiya A Bayne  Procedure(s) Performed: CESAREAN SECTION (N/A )     Patient location during evaluation: Mother Baby Anesthesia Type: Epidural Level of consciousness: awake and alert Pain management: pain level controlled Vital Signs Assessment: post-procedure vital signs reviewed and stable Respiratory status: spontaneous breathing, nonlabored ventilation and respiratory function stable Cardiovascular status: stable Postop Assessment: no headache, no backache and epidural receding Anesthetic complications: no    Last Vitals:  Vitals:   12/14/18 1835 12/14/18 1843  BP:    Pulse: (!) 117 (!) 109  Resp: 18 18  Temp: 36.6 C   SpO2: 99% 99%    Last Pain:  Vitals:   12/14/18 1843  TempSrc:   PainSc: 3    Pain Goal:    LLE Motor Response: Purposeful movement (12/14/18 1814) LLE Sensation: Decreased (12/14/18 1814) RLE Motor Response: Purposeful movement (12/14/18 1814) RLE Sensation: Decreased (12/14/18 1814)     Epidural/Spinal Function Cutaneous sensation: Normal sensation (12/14/18 1814), Patient able to flex knees: Yes (12/14/18 1814), Patient able to lift hips off bed: Yes (12/14/18 1814), Back pain beyond tenderness at insertion site: No (12/14/18 1814), Progressively worsening motor and/or sensory loss: No (12/14/18 1814), Bowel and/or bladder incontinence post epidural: No (12/14/18 1814)  Lynda Rainwater

## 2018-12-14 NOTE — Progress Notes (Addendum)
Update from [place] RN consult reviewed for pt. Message left with wound care on call RN.Three numbers called-no response. Voice message states wound RN out of office until Monday. Message left regarding patient status and consult for patient. EVS states no special linen available.

## 2018-12-14 NOTE — Progress Notes (Signed)
Labor Progress Note Lori Barnes is a 28 y.o. G1P0 at [redacted]w[redacted]d presented for IOL for gHTN  S:  Patient comfortable and sleeping  O:  BP (!) 143/87   Pulse 82   Temp 97.8 F (36.6 C) (Oral)   Resp 20   Ht 5\' 1"  (1.549 m)   Wt 91.7 kg   LMP 03/23/2018 (Approximate)   SpO2 100%   BMI 38.21 kg/m   Fetal Tracing:  Baseline: 125 Variability: moderate Accels: 15x15 Decels: none  Toco: 1-2   CVE: 6/100/0   [name]: 28 y.o. G1P0 [redacted]w[redacted]d IOL for gHTN #Labor: MVUs finally adequate since 0730 this am. On exam, cervix thin except on right side where scarring is palpated. Attempted manual removal of scar tissue with some movement. Cervix with some change from last exam. Will recheck in 1 hour. Dr. Harolyn Rutherford updated on plan of care #Pain: epidural #FWB: Cat 1 #GBS positive   Wende Mott, CNM 9:57 AM

## 2018-12-14 NOTE — Progress Notes (Signed)
CNM at bedside. No cervical change. While changing positions, area on right buttocks noted to be blistered. Charge RN notified and skin care protocol started. Dr. Harolyn Rutherford updated.  Wende Mott, CNM 12/14/18

## 2018-12-14 NOTE — Op Note (Signed)
[facility] PROCEDURE DATE: 12/14/2018  PREOPERATIVE DIAGNOSES: Intrauterine pregnancy at [redacted]w[redacted]d weeks gestation; failure to progress: arrest of dilation; failed induction for GHTN  POSTOPERATIVE DIAGNOSES: The same  PROCEDURE: Primary Low Transverse Cesarean Section  SURGEON:  Dr. Verita Schneiders  ASSISTANT:  Dr. Barrington Ellison  ANESTHESIOLOGY TEAM: Anesthesiologist: Nolon Nations, MD; Lynda Rainwater, MD CRNA: Rayvon Char, CRNA  INDICATIONS: Lori Barnes is a 28 y.o. G1P0 at [redacted]w[redacted]d here for cesarean section secondary to the indications listed under preoperative diagnoses; please see preoperative note for further details.  The risks of cesarean section were discussed with the patient including but were not limited to: bleeding which may require transfusion or reoperation; infection which may require antibiotics; injury to bowel, bladder, ureters or other surrounding organs; injury to the fetus; need for additional procedures including hysterectomy in the event of a life-threatening hemorrhage; placental abnormalities wth subsequent pregnancies, incisional problems, thromboembolic phenomenon and other postoperative/anesthesia complications.   The patient concurred with the proposed plan, giving informed written consent for the procedure.    FINDINGS:  Viable female infant in cephalic presentation.  Apgars 7 and 9.  Clear amniotic fluid.  Intact placenta, three vessel cord.  Normal uterus, fallopian tubes and ovaries bilaterally.  ANESTHESIA: Epidural  ESTIMATED BLOOD LOSS: 540 ml as per Triton SPECIMENS: Placenta sent to pathology COMPLICATIONS: None immediate  PROCEDURE IN DETAIL:  The patient preoperatively received intravenous antibiotics and had sequential compression devices applied to her lower extremities.  She was then taken to the operating room where the epidural anesthesia was dosed up to surgical level and was found to be adequate. She was then placed in a dorsal  supine position with a leftward tilt, and prepped and draped in a sterile manner.  A foley catheter was placed into her bladder and attached to constant gravity.  After an adequate timeout was performed, a Pfannenstiel skin incision was made with scalpel and carried through to the underlying layer of fascia. The fascia was incised in the midline, and this incision was extended bilaterally using the Mayo scissors.  Kocher clamps were applied to the superior aspect of the fascial incision and the underlying rectus muscles were dissected off bluntly and sharply.  A similar process was carried out on the inferior aspect of the fascial incision. The rectus muscles were separated in the midline and the peritoneum was entered bluntly. The Alexis self-retaining retractor was introduced into the abdominal cavity.  Attention was turned to the lower uterine segment where a low transverse hysterotomy was made with a scalpel and extended bilaterally bluntly.  The infant was successfully delivered, the cord was clamped and cut after one minute, and the infant was handed over to the awaiting neonatology team. Uterine massage was then administered, and the placenta delivered intact with a three-vessel cord. The uterus was then cleared of clots and debris.  The hysterotomy was closed with 0 Vicryl in a running locked fashion, and an imbricating layer was also placed with 0 Vicryl.  Figure-of-eight 0 Vicryl serosal stitches were placed to help with hemostasis.  The pelvis was cleared of all clot and debris. Hemostasis was confirmed on all surfaces.  The retractor was removed.  The peritoneum was closed with a 0 Vicryl running stitch. The fascia was then closed using 0 Vicryl in a running fashion.  The subcutaneous layer was irrigated, reapproximated with 2-0 plain gut interrupted stitches, and the skin was closed with a 4-0 Vicryl subcuticular stitch. The patient tolerated the procedure well.  Sponge, instrument and needle counts were  correct x 3.  She was taken to the recovery room in stable condition.    Jaynie Collins, MD, FACOG Obstetrician & Gynecologist, [facility] for [employer], [facility]

## 2018-12-14 NOTE — Progress Notes (Signed)
Lori Barnes is a 28 y.o. G1P0 at [redacted]w[redacted]d admitted for induction of labor due to gHTN  Subjective: Feels like her contractions just started to intensify around 6-6:30, breathing through them  Objective: BP 130/75   Pulse 80   Temp 98.7 F (37.1 C) (Oral)   Resp 14   Ht 5\' 1"  (1.549 m)   Wt 91.7 kg   LMP 03/23/2018 (Approximate)   SpO2 100%   BMI 38.21 kg/m  No intake/output data recorded.  FHT:  FHR: 130 bpm, variability: moderate,  accelerations:  Present,  decelerations:  Absent UC:   regular, every 2-3 minutes  SVE:   Dilation: 5 Effacement (%): 70, 80 Station: -1 Exam by:: Sr. Spiracino   Pitocin @ 30 mu/min  Labs: Lab Results  Component Value Date   WBC 12.8 (H) 12/12/2018   HGB 11.1 (L) 12/12/2018   HCT 32.5 (L) 12/12/2018   MCV 94.5 12/12/2018   PLT 140 (L) 12/12/2018    Assessment / Plan: 28 yo G1P0 at 6.6 EGA here for IOL 2/2 gHTN  Labor: s/p cytotec and FB, s/p AROM, pit break x2. Pitocin now at 30, contractions just now becoming adequate with MVUs calculated at 220. Fetal Wellbeing:  Category I Pain Control:  Epidural I/D:  GBS pos, on PCN Anticipated MOD:  guarded prognosis for vaginal delivery, CS as appropriate.   A long discussion was had with the patient about the difficulty of getting her into active labor despite multiple interventions. She is feeling discouraged about the lack of change in her cervical exam. Her MVUs have just become adequate within the last hour, however, so we can wait a few more hours to check her again to see if she makes cervical change. If she is unchanged at her next cervical exam, despite adequate MVUs, then she is amenable to proceed with pLTCS for failed induction of labor.  [name] DO OB Fellow, [facility] 12/14/2018, 7:17 AM

## 2018-12-15 ENCOUNTER — Encounter (HOSPITAL_COMMUNITY): Payer: Self-pay | Admitting: Obstetrics & Gynecology

## 2018-12-15 ENCOUNTER — Inpatient Hospital Stay (HOSPITAL_COMMUNITY): Payer: Medicaid Other

## 2018-12-15 LAB — CBC
HCT: 22.2 % — ABNORMAL LOW (ref 36.0–46.0)
Hemoglobin: 7.4 g/dL — ABNORMAL LOW (ref 12.0–15.0)
MCH: 31.9 pg (ref 26.0–34.0)
MCHC: 33.3 g/dL (ref 30.0–36.0)
MCV: 95.7 fL (ref 80.0–100.0)
Platelets: 118 10*3/uL — ABNORMAL LOW (ref 150–400)
RBC: 2.32 MIL/uL — ABNORMAL LOW (ref 3.87–5.11)
RDW: 13.1 % (ref 11.5–15.5)
WBC: 15.8 10*3/uL — ABNORMAL HIGH (ref 4.0–10.5)
nRBC: 0 % (ref 0.0–0.2)

## 2018-12-15 LAB — COMPREHENSIVE METABOLIC PANEL
ALT: 12 U/L (ref 0–44)
AST: 28 U/L (ref 15–41)
Albumin: 2.1 g/dL — ABNORMAL LOW (ref 3.5–5.0)
Alkaline Phosphatase: 84 U/L (ref 38–126)
Anion gap: 8 (ref 5–15)
BUN: 5 mg/dL — ABNORMAL LOW (ref 6–20)
CO2: 20 mmol/L — ABNORMAL LOW (ref 22–32)
Calcium: 8.4 mg/dL — ABNORMAL LOW (ref 8.9–10.3)
Chloride: 108 mmol/L (ref 98–111)
Creatinine, Ser: 0.76 mg/dL (ref 0.44–1.00)
GFR calc Af Amer: >60 mL/min (ref >60)
GFR calc non Af Amer: >60 mL/min (ref >60)
Glucose, Bld: 107 mg/dL — ABNORMAL HIGH (ref 70–99)
Potassium: 4.5 mmol/L (ref 3.5–5.1)
Sodium: 136 mmol/L (ref 135–145)
Total Bilirubin: 0.1 mg/dL — ABNORMAL LOW (ref 0.3–1.2)
Total Protein: 4.8 g/dL — ABNORMAL LOW (ref 6.5–8.1)

## 2018-12-15 LAB — GC/CHLAMYDIA PROBE AMP
Chlamydia trachomatis, NAA: NEGATIVE
Neisseria Gonorrhoeae by PCR: NEGATIVE

## 2018-12-15 MED ORDER — ETONOGESTREL 68 MG ~~LOC~~ IMPL
68.0000 mg | DRUG_IMPLANT | Freq: Once | SUBCUTANEOUS | Status: AC
  Administered 2018-12-15: 68 mg via SUBCUTANEOUS
  Filled 2018-12-15: qty 1

## 2018-12-15 MED ORDER — LIDOCAINE HCL 1 % IJ SOLN
0.0000 mL | Freq: Once | INTRAMUSCULAR | Status: AC | PRN
  Administered 2018-12-15: 20 mL via INTRADERMAL
  Filled 2018-12-15 (×2): qty 20

## 2018-12-15 MED ORDER — ACETAMINOPHEN 325 MG PO TABS
650.0000 mg | ORAL_TABLET | Freq: Four times a day (QID) | ORAL | Status: DC
  Administered 2018-12-15 – 2018-12-16 (×5): 650 mg via ORAL
  Filled 2018-12-15 (×5): qty 2

## 2018-12-15 MED ORDER — ALBUTEROL SULFATE (2.5 MG/3ML) 0.083% IN NEBU: 5.0000 mg | INHALATION_SOLUTION | RESPIRATORY_TRACT | Status: DC | PRN

## 2018-12-15 MED ORDER — ALBUTEROL SULFATE (2.5 MG/3ML) 0.083% IN NEBU
2.5000 mg | INHALATION_SOLUTION | Freq: Two times a day (BID) | RESPIRATORY_TRACT | Status: DC
  Administered 2018-12-16: 2.5 mg via RESPIRATORY_TRACT
  Filled 2018-12-15: qty 3

## 2018-12-15 MED ORDER — ALBUTEROL SULFATE (2.5 MG/3ML) 0.083% IN NEBU
5.0000 mg | INHALATION_SOLUTION | RESPIRATORY_TRACT | Status: DC
  Administered 2018-12-15 (×4): 5 mg via RESPIRATORY_TRACT
  Filled 2018-12-15 (×4): qty 6

## 2018-12-15 NOTE — Procedures (Signed)
GYNECOLOGY PROCEDURE NOTE  Lori Barnes is a 28 y.o. G1P1001 requesting Nexplanon insertion. No gynecologic concerns.  Nexplanon Insertion Procedure Patient identified, informed consent performed, consent signed. Patient does understand that irregular bleeding is a very common side effect of this medication. She was advised to have backup contraception for one week after placement. Appropriate time out taken. Patient's left arm was prepped and draped in the usual sterile fashion. The insertion area was measured and marked. Patient was prepped with alcohol swab and then injected with 3 ml of 1% lidocaine. The area was then prepped with betadine. Nexplanon removed from packaging and device confirmed present within needle, then inserted full length of needle and withdrawn per handbook instructions. Nexplanon was able to palpated in the patient's arm; patient palpated the insert herself. There was minimal blood loss. Patient insertion site covered with steri strip, guaze, and a pressure bandage to reduce any bruising. The patient tolerated the procedure well and was given post procedure instructions.   Julianne Handler, [place] for [place]

## 2018-12-15 NOTE — Progress Notes (Addendum)
Lori Barnes is a 28 yr old PPD1, POD#1 following PLTCS  Subjective: Pt reports dry cough and wheezing since c-section. Denies fevers. No productive sputum. Lochia appropriate, no clots. Has changed pads twice. Breast feeding baby. Abdomen cramps well controlled with analgesia.   Objective: Blood pressure (!) 142/90, pulse (!) 105, temperature 97.7 F (36.5 C), temperature source Oral, resp. rate 20, height 5\' 1"  (1.549 m), weight 91.7 kg, last menstrual period 03/23/2018, SpO2 97 %, unknown if currently breastfeeding.  Physical Exam:  General: alert, cooperative and appears stated age  Cardiac: RRR, no rubs or gallops Pulm: bilateral wheeze, no crackles, no respiratory distress  GI: abdo soft, mildly tender in pelvis Lochia: appropriate Uterine Fundus: firm Incision: no significant drainage, no dehiscence, no significant erythema DVT Evaluation: No cords or calf tenderness. No significant calf/ankle edema.  Recent Labs    12/12/18 2253 12/15/18 0436  HGB 11.1* 7.4*  HCT 32.5* 22.2*    Assessment/Plan: Lori Barnes is a 28 yr old PPD1, POD#1 following PLTCS. Likely secondary to underlying chronic tobacco abuse as vitals signs are stable, patient is afebrile, and O2 saturations are within normal limits vs atelectasis. 1) CXR 2) Q4hrly Albuterol 5mg  nebulizers 3) Encourage mobilization, incentive spirometry 4) Monitor vital signs   Continue routine post-op/postpartum care   LOS: 4 days   Lori Barnes 12/15/2018, 8:22 AM   GME ATTESTATION:  I saw and evaluated the patient. I agree with the findings and the plan of care as documented in the resident's note.  [name], DO OB Fellow, [facility] 12/15/2018 8:31 AM

## 2018-12-15 NOTE — Progress Notes (Signed)
Called Dr. Posey Pronto regarding ?blister wound on R buttock. Dressing wet and peeling off.Dressing reapplied. Dr. Posey Pronto to put in wound consult.

## 2018-12-16 ENCOUNTER — Telehealth: Payer: Self-pay | Admitting: *Deleted

## 2018-12-16 ENCOUNTER — Telehealth: Payer: Self-pay | Admitting: Physician Assistant

## 2018-12-16 MED ORDER — FERROUS SULFATE 325 (65 FE) MG PO TABS: 325.0000 mg | ORAL_TABLET | Freq: Two times a day (BID) | ORAL | 0 refills | Status: DC

## 2018-12-16 MED ORDER — IBUPROFEN 600 MG PO TABS: 600.0000 mg | ORAL_TABLET | Freq: Four times a day (QID) | ORAL | 0 refills | Status: DC | PRN

## 2018-12-16 MED ORDER — ALBUTEROL SULFATE HFA 108 (90 BASE) MCG/ACT IN AERS: 2.0000 | INHALATION_SPRAY | RESPIRATORY_TRACT | 0 refills | Status: DC | PRN

## 2018-12-16 MED ORDER — OXYCODONE HCL 5 MG PO TABS: 5.0000 mg | ORAL_TABLET | ORAL | 0 refills | Status: DC | PRN

## 2018-12-16 NOTE — Discharge Instructions (Signed)
Postpartum Care After Cesarean Delivery This sheet gives you information about how to care for yourself from the time you deliver your baby to up to 6-12 weeks after delivery (postpartum period). Your health care provider may also give you more specific instructions. If you have problems or questions, contact your health care provider. Follow these instructions at home: Medicines  Take over-the-counter and prescription medicines only as told by your health care provider.  If you were prescribed an antibiotic medicine, take it as told by your health care provider. Do not stop taking the antibiotic even if you start to feel better.  Ask your health care provider if the medicine prescribed to you: ? Requires you to avoid driving or using heavy machinery. ? Can cause constipation. You may need to take actions to prevent or treat constipation, such as:  Drink enough fluid to keep your urine pale yellow.  Take over-the-counter or prescription medicines.  Eat foods that are high in fiber, such as beans, whole grains, and fresh fruits and vegetables.  Limit foods that are high in fat and processed sugars, such as fried or sweet foods. Activity  Gradually return to your normal activities as told by your health care provider.  Avoid activities that take a lot of effort and energy (are strenuous) until approved by your health care provider. Walking at a slow to moderate pace is usually safe. Ask your health care provider what activities are safe for you. ? Do not lift anything that is heavier than your baby or 10 lb (4.5 kg) as told by your health care provider. ? Do not vacuum, climb stairs, or drive a car for as long as told by your health care provider.  If possible, have someone help you at home until you are able to do your usual activities yourself.  Rest as much as possible. Try to rest or take naps while your baby is sleeping. Vaginal bleeding  It is normal to have vaginal bleeding  (lochia) after delivery. Wear a sanitary pad to absorb vaginal bleeding and discharge. ? During the first week after delivery, the amount and appearance of lochia is often similar to a menstrual period. ? Over the next few weeks, it will gradually decrease to a dry, yellow-brown discharge. ? For most women, lochia stops completely by 4-6 weeks after delivery. Vaginal bleeding can vary from woman to woman.  Change your sanitary pads frequently. Watch for any changes in your flow, such as: ? A sudden increase in volume. ? A change in color. ? Large blood clots.  If you pass a blood clot, save it and call your health care provider to discuss. Do not flush blood clots down the toilet before you get instructions from your health care provider.  Do not use tampons or douches until your health care provider says this is safe.  If you are not breastfeeding, your period should return 6-8 weeks after delivery. If you are breastfeeding, your period may return anytime between 8 weeks after delivery and the time that you stop breastfeeding. Perineal care   If your C-section (Cesarean section) was unplanned, and you were allowed to labor and push before delivery, you may have pain, swelling, and discomfort of the tissue between your vaginal opening and your anus (perineum). You may also have an incision in the tissue (episiotomy) or the tissue may have torn during delivery. Follow these instructions as told by your health care provider: ? Keep your perineum clean and dry as told by  your health care provider. Use medicated pads and pain-relieving sprays and creams as directed. ? If you have an episiotomy or vaginal tear, check the area every day for signs of infection. Check for:  Redness, swelling, or pain.  Fluid or blood.  Warmth.  Pus or a bad smell. ? You may be given a squirt bottle to use instead of wiping to clean the perineum area after you go to the bathroom. As you start healing, you may use  the squirt bottle before wiping yourself. Make sure to wipe gently. ? To relieve pain caused by an episiotomy, vaginal tear, or hemorrhoids, try taking a warm sitz bath 2-3 times a day. A sitz bath is a warm water bath that is taken while you are sitting down. The water should only come up to your hips and should cover your buttocks. Breast care  Within the first few days after delivery, your breasts may feel heavy, full, and uncomfortable (breast engorgement). You may also have milk leaking from your breasts. Your health care provider can suggest ways to help relieve breast discomfort. Breast engorgement should go away within a few days.  If you are breastfeeding: ? Wear a bra that supports your breasts and fits you well. ? Keep your nipples clean and dry. Apply creams and ointments as told by your health care provider. ? You may need to use breast pads to absorb milk leakage. ? You may have uterine contractions every time you breastfeed for several weeks after delivery. Uterine contractions help your uterus return to its normal size. ? If you have any problems with breastfeeding, work with your health care provider or a Science writer.  If you are not breastfeeding: ? Avoid touching your breasts as this can make your breasts produce more milk. ? Wear a well-fitting bra and use cold packs to help with swelling. ? Do not squeeze out (express) milk. This causes you to make more milk. Intimacy and sexuality  Ask your health care provider when you can engage in sexual activity. This may depend on your: ? Risk of infection. ? Healing rate. ? Comfort and desire to engage in sexual activity.  You are able to get pregnant after delivery, even if you have not had your period. If desired, talk with your health care provider about methods of family planning or birth control (contraception). Lifestyle  Do not use any products that contain nicotine or tobacco, such as cigarettes, e-cigarettes,  and chewing tobacco. If you need help quitting, ask your health care provider.  Do not drink alcohol, especially if you are breastfeeding. Eating and drinking   Drink enough fluid to keep your urine pale yellow.  Eat high-fiber foods every day. These may help prevent or relieve constipation. High-fiber foods include: ? Whole grain cereals and breads. ? Brown rice. ? Beans. ? Fresh fruits and vegetables.  Take your prenatal vitamins until your postpartum checkup or until your health care provider tells you it is okay to stop. General instructions  Keep all follow-up visits for you and your baby as told by your health care provider. Most women visit their health care provider for a postpartum checkup within the first 3-6 weeks after delivery. Contact a health care provider if you:  Feel unable to cope with the changes that a new baby brings to your life, and these feelings do not go away.  Feel unusually sad or worried.  Have breasts that are painful, hard, or turn red.  Have a fever.  Have trouble holding urine or keeping urine from leaking.  Have little or no interest in activities you used to enjoy.  Have not breastfed at all and you have not had a menstrual period for 12 weeks after delivery.  Have stopped breastfeeding and you have not had a menstrual period for 12 weeks after you stopped breastfeeding.  Have questions about caring for yourself or your baby.  Pass a blood clot from your vagina. Get help right away if you:  Have chest pain.  Have difficulty breathing.  Have sudden, severe leg pain.  Have severe pain or cramping in your abdomen.  Bleed from your vagina so much that you fill more than one sanitary pad in one hour. Bleeding should not be heavier than your heaviest period.  Develop a severe headache.  Faint.  Have blurred vision or spots in your vision.  Have a bad-smelling vaginal discharge.  Have thoughts about hurting yourself or your  baby. If you ever feel like you may hurt yourself or others, or have thoughts about taking your own life, get help right away. You can go to your nearest emergency department or call:  Your local emergency services (911 in the [place]  A suicide crisis helpline, such as the [facility] at 916-161-6658. This is open 24 hours a day. Summary  The period of time from when you deliver your baby to up to 6-12 weeks after delivery is called the postpartum period.  Gradually return to your normal activities as told by your health care provider.  Keep all follow-up visits for you and your baby as told by your health care provider. This information is not intended to replace advice given to you by your health care provider. Make sure you discuss any questions you have with your health care provider. Document Released: 03/03/2000 Document Revised: 10/24/2017 Document Reviewed: 10/24/2017 Elsevier Patient Education  [place].    Anemia  Anemia is a condition in which you do not have enough red blood cells or hemoglobin. Hemoglobin is a substance in red blood cells that carries oxygen. When you do not have enough red blood cells or hemoglobin (are anemic), your body cannot get enough oxygen and your organs may not work properly. As a result, you may feel very tired or have other problems. What are the causes? Common causes of anemia include:  Excessive bleeding. Anemia can be caused by excessive bleeding inside or outside the body, including bleeding from the intestine or from periods in women.  Poor nutrition.  Long-lasting (chronic) kidney, thyroid, and liver disease.  Bone marrow disorders.  Cancer and treatments for cancer.  HIV (human immunodeficiency virus) and AIDS (acquired immunodeficiency syndrome).  Treatments for HIV and AIDS.  Spleen problems.  Blood disorders.  Infections, medicines, and autoimmune disorders that destroy red blood  cells. What are the signs or symptoms? Symptoms of this condition include:  Minor weakness.  Dizziness.  Headache.  Feeling heartbeats that are irregular or faster than normal (palpitations).  Shortness of breath, especially with exercise.  Paleness.  Cold sensitivity.  Indigestion.  Nausea.  Difficulty sleeping.  Difficulty concentrating. Symptoms may occur suddenly or develop slowly. If your anemia is mild, you may not have symptoms. How is this diagnosed? This condition is diagnosed based on:  Blood tests.  Your medical history.  A physical exam.  Bone marrow biopsy. Your health care provider may also check your stool (feces) for blood and may do additional testing to look for  the cause of your bleeding. You may also have other tests, including:  Imaging tests, such as a CT scan or MRI.  Endoscopy.  Colonoscopy. How is this treated? Treatment for this condition depends on the cause. If you continue to lose a lot of blood, you may need to be treated at a hospital. Treatment may include:  Taking supplements of iron, vitamin Y48, or folic acid.  Taking a hormone medicine (erythropoietin) that can help to stimulate red blood cell growth.  Having a blood transfusion. This may be needed if you lose a lot of blood.  Making changes to your diet.  Having surgery to remove your spleen. Follow these instructions at home:  Take over-the-counter and prescription medicines only as told by your health care provider.  Take supplements only as told by your health care provider.  Follow any diet instructions that you were given.  Keep all follow-up visits as told by your health care provider. This is important. Contact a health care provider if:  You develop new bleeding anywhere in the body. Get help right away if:  You are very weak.  You are short of breath.  You have pain in your abdomen or chest.  You are dizzy or feel faint.  You have trouble  concentrating.  You have bloody or black, tarry stools.  You vomit repeatedly or you vomit up blood. Summary  Anemia is a condition in which you do not have enough red blood cells or enough of a substance in your red blood cells that carries oxygen (hemoglobin).  Symptoms may occur suddenly or develop slowly.  If your anemia is mild, you may not have symptoms.  This condition is diagnosed with blood tests as well as a medical history and physical exam. Other tests may be needed.  Treatment for this condition depends on the cause of the anemia. This information is not intended to replace advice given to you by your health care provider. Make sure you discuss any questions you have with your health care provider. Document Released: 04/13/2004 Document Revised: 02/16/2017 Document Reviewed: 04/07/2016 Elsevier Patient Education  2020 [employer].

## 2018-12-16 NOTE — Progress Notes (Addendum)
Subjective: Postpartum Day 2: Cesarean Delivery  Patient was examined in the presence of her significant other. She claims to be feeling much better this morning. Endorses pain at her incisional site that is aggravated by ambulation. This pain is well controlled with her current pain regimen. Denies persistence of dry cough. Notes adherence to nebulizer treatments and incentive spirometry. Lochia is appropriate and without clots. Her diet has progressed. Endorses normal voiding and regular bowel movements. She is ambulating without assistance but notes minor fatigue when doing so. Denies lightheadedness, headache, abdominal pain/cramping or shortness of breath. Patient would prefer to be discharged today as she has additional family support at home.    Objective: Vital signs in last 24 hours: Temp:  [97.6 F (36.4 C)-98 F (36.7 C)] 97.6 F (36.4 C) (09/28 0612) Pulse Rate:  [105-110] 110 (09/28 0612) Resp:  [16-20] 20 (09/28 0612) BP: (115-131)/(61-81) 129/81 (09/28 0612) SpO2:  [97 %-98 %] 98 % (09/28 0612)  Physical Exam:  General: alert, cooperative, appears stated age, fatigued and no distress  Cardio: RRR, 2+ radial pulse Pulm: Bilateral wheezes, normal work of breathing  HEENT: No scleral icterus  Lochia: appropriate Uterine Fundus: firm Incision: healing well, no significant drainage DVT Evaluation: No evidence of DVT seen on physical exam. No significant calf/ankle edema.  Recent Labs    12/15/18 0436  HGB 7.4*  HCT 22.2*    Assessment/Plan: Status post Cesarean section. Doing well postoperatively.  -- Continue albuterol nebulizer, incentive spirometry, and ambulation protocol -- Continue oral ferrous sulfate -- Patient received nexplanon contraceptive last night -- Bottle feeding baby appropriately  -- Outpatient post-partum hypertension control    Lori Barnes 12/16/2018, 7:43 AM   Provider attestation I have seen and examined this patient and agree with  above documentation in the medical student's note.   Post Partum Day 2  Lori Barnes is a 28 y.o. G1P1001 s/p LTCS.  Pt denies problems with ambulating, voiding or po intake. Pain is well controlled. Method of Feeding: bottle  PE:  Gen: well appearing Heart: reg rate Lungs: normal WOB Fundus firm Ext: soft, no pain, no edema  Assessment: S/p LTCS PPD #2  Plan for discharge: patient requesting to be discharged today  Jorje Guild, NP 8:02 PM

## 2018-12-16 NOTE — Telephone Encounter (Signed)
First available appointment is 01/06/2019, patient scheduled.

## 2018-12-16 NOTE — Telephone Encounter (Signed)
Patient states since being discharged, she has noticed increased swelling in her lower extremities.  Informed patient this is not uncommon post c/s and may get worse before it gets better. Encouraged patient to keep LE elevated when not up but should get up and move around to avoid developing a blood clot.  Pt verbalized understanding.

## 2018-12-17 ENCOUNTER — Ambulatory Visit: Payer: Self-pay | Admitting: *Deleted

## 2018-12-17 ENCOUNTER — Telehealth: Payer: Self-pay | Admitting: *Deleted

## 2018-12-17 LAB — SURGICAL PATHOLOGY

## 2018-12-17 NOTE — Telephone Encounter (Signed)
Patient is calling with severe leg swelling. Patient reports the swelling is all the way up to the hips- she has developed a knot at the C-section incision site. Patient states her feet are painful and she has purple and red in feet and ankles. Explained that some swelling is normal- but this is excessive- advised ED.  Reason for Disposition . SEVERE leg swelling (e.g., swelling extends above knee, entire leg is swollen)  Answer Assessment - Initial Assessment Questions 1. ONSET: "When did the swelling start?" (e.g., minutes, hours, days)     Yesterday about lunch time 2. LOCATION: "What part of the leg is swollen?"  "Are both legs swollen or just one leg?"     Both legs 3. SEVERITY: "How bad is the swelling?" (e.g., localized; mild, moderate, severe)  - Localized - small area of swelling localized to one leg  - MILD pedal edema - swelling limited to foot and ankle, pitting edema < 1/4 inch (6 mm) deep, rest and elevation eliminate most or all swelling  - MODERATE edema - swelling of lower leg to knee, pitting edema > 1/4 inch (6 mm) deep, rest and elevation only partially reduce swelling  - SEVERE edema - swelling extends above knee, facial or hand swelling present      All the way to hip 4. REDNESS: "Does the swelling look red or infected?"     Purple rings at ankles, redness in top of foot 5. PAIN: "Is the swelling painful to touch?" If so, ask: "How painful is it?"   (Scale 1-10; mild, moderate or severe)     painfull 6. FEVER: "Do you have a fever?" If so, ask: "What is it, how was it measured, and when did it start?"      No fever 7. MEDICAL HISTORY: "Do you have a history of blood clots, heart failure, kidney disease, liver failure, or cancer?"     no 8. OTHER SYMPTOMS: "Do you have any other symptoms?" (e.g., chest pain, difficulty breathing)     Chest pain 9. DELIVERY DATE: "When was your delivery date?" "Vaginal delivery or c-section?"     C-section- 9/26  Protocols used:  POSTPARTUM - LEG SWELLING AND EDEMA-A-AH

## 2018-12-17 NOTE — Telephone Encounter (Signed)
Patient called regarding LE edema.  States she took baby to peds visit and was told swelling was not normal and needed to be seen.

## 2018-12-17 NOTE — Telephone Encounter (Signed)
LMOVM returning patient's call.  Informed that swelling most always gets worse before getting better but to call our office in the morning and ask to speak directly to me.

## 2018-12-18 ENCOUNTER — Other Ambulatory Visit: Payer: Self-pay

## 2018-12-18 ENCOUNTER — Telehealth: Payer: Medicaid Other

## 2018-12-18 ENCOUNTER — Ambulatory Visit (INDEPENDENT_AMBULATORY_CARE_PROVIDER_SITE_OTHER): Payer: Medicaid Other | Admitting: Advanced Practice Midwife

## 2018-12-18 ENCOUNTER — Inpatient Hospital Stay (HOSPITAL_COMMUNITY): Payer: Medicaid Other

## 2018-12-18 ENCOUNTER — Inpatient Hospital Stay (HOSPITAL_COMMUNITY)
Admission: AD | Admit: 2018-12-18 | Discharge: 2018-12-20 | DRG: 776 | Disposition: A | Discharge status: Home / Self Care | Payer: Medicaid Other | Attending: Obstetrics and Gynecology | Admitting: Obstetrics and Gynecology

## 2018-12-18 ENCOUNTER — Encounter: Payer: Self-pay | Admitting: Advanced Practice Midwife

## 2018-12-18 ENCOUNTER — Encounter (HOSPITAL_COMMUNITY): Payer: Self-pay | Admitting: *Deleted

## 2018-12-18 VITALS — BP 174/97 | HR 108 | Ht 61.0 in | Wt 204.6 lb

## 2018-12-18 DIAGNOSIS — O165 Unspecified maternal hypertension, complicating the puerperium: Secondary | ICD-10-CM

## 2018-12-18 DIAGNOSIS — F53 Postpartum depression: Secondary | ICD-10-CM | POA: Diagnosis present

## 2018-12-18 DIAGNOSIS — O99345 Other mental disorders complicating the puerperium: Secondary | ICD-10-CM | POA: Diagnosis present

## 2018-12-18 DIAGNOSIS — O9089 Other complications of the puerperium, not elsewhere classified: Secondary | ICD-10-CM | POA: Diagnosis present

## 2018-12-18 DIAGNOSIS — L98419 Non-pressure chronic ulcer of buttock with unspecified severity: Secondary | ICD-10-CM | POA: Diagnosis present

## 2018-12-18 DIAGNOSIS — Z98891 History of uterine scar from previous surgery: Secondary | ICD-10-CM

## 2018-12-18 DIAGNOSIS — O9081 Anemia of the puerperium: Secondary | ICD-10-CM | POA: Diagnosis present

## 2018-12-18 DIAGNOSIS — O1415 Severe pre-eclampsia, complicating the puerperium: Secondary | ICD-10-CM | POA: Diagnosis not present

## 2018-12-18 DIAGNOSIS — I10 Essential (primary) hypertension: Secondary | ICD-10-CM | POA: Diagnosis not present

## 2018-12-18 DIAGNOSIS — R87629 Unspecified abnormal cytological findings in specimens from vagina: Secondary | ICD-10-CM | POA: Diagnosis present

## 2018-12-18 HISTORY — DX: Depression, unspecified: F32.A

## 2018-12-18 LAB — CBC WITH DIFFERENTIAL/PLATELET
Abs Immature Granulocytes: 0.14 10*3/uL — ABNORMAL HIGH (ref 0.00–0.07)
Basophils Absolute: 0 10*3/uL (ref 0.0–0.1)
Basophils Relative: 0 %
Eosinophils Absolute: 0.2 10*3/uL (ref 0.0–0.5)
Eosinophils Relative: 2 %
HCT: 21.5 % — ABNORMAL LOW (ref 36.0–46.0)
Hemoglobin: 7.3 g/dL — ABNORMAL LOW (ref 12.0–15.0)
Immature Granulocytes: 2 %
Lymphocytes Relative: 22 %
Lymphs Abs: 1.8 10*3/uL (ref 0.7–4.0)
MCH: 32.4 pg (ref 26.0–34.0)
MCHC: 34 g/dL (ref 30.0–36.0)
MCV: 95.6 fL (ref 80.0–100.0)
Monocytes Absolute: 0.5 10*3/uL (ref 0.1–1.0)
Monocytes Relative: 7 %
Neutro Abs: 5.5 10*3/uL (ref 1.7–7.7)
Neutrophils Relative %: 67 %
Platelets: 209 10*3/uL (ref 150–400)
RBC: 2.25 MIL/uL — ABNORMAL LOW (ref 3.87–5.11)
RDW: 13.6 % (ref 11.5–15.5)
WBC: 8.1 10*3/uL (ref 4.0–10.5)
nRBC: 0.2 % (ref 0.0–0.2)

## 2018-12-18 LAB — URINALYSIS, ROUTINE W REFLEX MICROSCOPIC
Bilirubin Urine: NEGATIVE
Glucose, UA: NEGATIVE mg/dL
Ketones, ur: NEGATIVE mg/dL
Nitrite: NEGATIVE
Protein, ur: NEGATIVE mg/dL
RBC / HPF: >50 RBC/hpf — ABNORMAL HIGH (ref 0–5)
Specific Gravity, Urine: 1.011 (ref 1.005–1.030)
pH: 7 (ref 5.0–8.0)

## 2018-12-18 LAB — COMPREHENSIVE METABOLIC PANEL
ALT: 28 U/L (ref 0–44)
AST: 35 U/L (ref 15–41)
Albumin: 2.5 g/dL — ABNORMAL LOW (ref 3.5–5.0)
Alkaline Phosphatase: 71 U/L (ref 38–126)
Anion gap: 10 (ref 5–15)
BUN: 11 mg/dL (ref 6–20)
CO2: 23 mmol/L (ref 22–32)
Calcium: 8.7 mg/dL — ABNORMAL LOW (ref 8.9–10.3)
Chloride: 105 mmol/L (ref 98–111)
Creatinine, Ser: 0.75 mg/dL (ref 0.44–1.00)
GFR calc Af Amer: >60 mL/min (ref >60)
GFR calc non Af Amer: >60 mL/min (ref >60)
Glucose, Bld: 87 mg/dL (ref 70–99)
Potassium: 3.8 mmol/L (ref 3.5–5.1)
Sodium: 138 mmol/L (ref 135–145)
Total Bilirubin: 0.5 mg/dL (ref 0.3–1.2)
Total Protein: 5.7 g/dL — ABNORMAL LOW (ref 6.5–8.1)

## 2018-12-18 MED ORDER — MAGNESIUM SULFATE BOLUS VIA INFUSION
4.0000 g | Freq: Once | INTRAVENOUS | Status: AC
  Administered 2018-12-18: 4 g via INTRAVENOUS
  Filled 2018-12-18: qty 500

## 2018-12-18 MED ORDER — MAGNESIUM SULFATE 40 G IN LACTATED RINGERS - SIMPLE
2.0000 g/h | INTRAVENOUS | Status: AC
  Administered 2018-12-19: 2 g/h via INTRAVENOUS
  Filled 2018-12-18 (×2): qty 500

## 2018-12-18 MED ORDER — LABETALOL HCL 5 MG/ML IV SOLN: 20.0000 mg | INTRAVENOUS | Status: DC | PRN

## 2018-12-18 MED ORDER — LABETALOL HCL 5 MG/ML IV SOLN: 80.0000 mg | INTRAVENOUS | Status: DC | PRN

## 2018-12-18 MED ORDER — AMLODIPINE BESYLATE 5 MG PO TABS
5.0000 mg | ORAL_TABLET | Freq: Every day | ORAL | Status: DC
  Administered 2018-12-19: 5 mg via ORAL
  Filled 2018-12-18: qty 1

## 2018-12-18 MED ORDER — LABETALOL HCL 5 MG/ML IV SOLN: 40.0000 mg | INTRAVENOUS | Status: DC | PRN

## 2018-12-18 MED ORDER — NIFEDIPINE 10 MG PO CAPS
20.0000 mg | ORAL_CAPSULE | ORAL | Status: DC | PRN
  Filled 2018-12-18: qty 2

## 2018-12-18 MED ORDER — HYDROCHLOROTHIAZIDE 25 MG PO TABS: 25.0000 mg | ORAL_TABLET | Freq: Every day | ORAL | Status: DC

## 2018-12-18 MED ORDER — LACTATED RINGERS IV SOLN
INTRAVENOUS | Status: DC
  Administered 2018-12-18 – 2018-12-19 (×2): via INTRAVENOUS

## 2018-12-18 MED ORDER — FUROSEMIDE 10 MG/ML IJ SOLN
10.0000 mg | Freq: Two times a day (BID) | INTRAMUSCULAR | Status: DC
  Administered 2018-12-18 – 2018-12-19 (×2): 10 mg via INTRAVENOUS
  Filled 2018-12-18 (×2): qty 2

## 2018-12-18 MED ORDER — HYDRALAZINE HCL 20 MG/ML IJ SOLN: 10.0000 mg | INTRAMUSCULAR | Status: DC | PRN

## 2018-12-18 MED ORDER — OXYCODONE HCL 5 MG PO TABS
5.0000 mg | ORAL_TABLET | ORAL | Status: DC | PRN
  Administered 2018-12-18 – 2018-12-20 (×6): 5 mg via ORAL
  Filled 2018-12-18 (×6): qty 1

## 2018-12-18 MED ORDER — BREAST PUMP MISC: 1.0000 [IU] | 0 refills | Status: DC | PRN

## 2018-12-18 MED ORDER — NIFEDIPINE 10 MG PO CAPS: 20.0000 mg | ORAL_CAPSULE | ORAL | Status: DC | PRN

## 2018-12-18 MED ORDER — NIFEDIPINE 10 MG PO CAPS
10.0000 mg | ORAL_CAPSULE | ORAL | Status: DC | PRN
  Administered 2018-12-18: 10 mg via ORAL
  Filled 2018-12-18: qty 1

## 2018-12-18 NOTE — Plan of Care (Signed)
  Problem: Education: Goal: Knowledge of General Education information will improve Description: Including pain rating scale, medication(s)/side effects and non-pharmacologic comfort measures Outcome: Completed/Met

## 2018-12-18 NOTE — Progress Notes (Signed)
Patient ID: Marisue Ivan, female   DOB: [birth year], 28 y.o.   MRN: [id number]    GYN VISIT Patient name: Lori Barnes MRN [id number]  Date of birth: Feb 09, 1991 Chief Complaint:   Postpartum Care (swelling)  History of Present Illness:   Lori Barnes is a 28 y.o. G61P1001 Caucasian female being seen today for s/p pLTCS delivery on 12/14/18 now with increasing LE edema and elevated BPs. Very mild SOB. Noticed edema on 9/28 and it has been increasing since. Also with occasional 'spots' in her periphery.  She was induced for gHTN with a 3 day induction course ending in pLTCS for failure to progress. Her BPs pp were within nl range and did not require antihypertensives. She was d/c home on 12/16/18.  No LMP recorded. The current method of family planning is Nexplanon- placed inpatient  Review of Systems:   Pertinent items are noted in HPI Denies fever/chills, dizziness, headaches, chest pain, abdominal pain, vomiting, abnormal vaginal discharge/itching/odor/irritation, problems with periods, bowel movements, urination, or intercourse unless otherwise stated above.  Pertinent History Reviewed:  Reviewed past medical,surgical, social, obstetrical and family history.  Reviewed problem list, medications and allergies. Physical Assessment:   Vitals:   12/18/18 1053 12/18/18 1056  BP: (!) 157/96 (!) 174/97  Pulse: (!) 104 (!) 108  Weight: 204 lb 9.6 oz (92.8 kg)   Height: 5\' 1"  (1.549 m)   Body mass index is 38.66 kg/m.       Physical Examination:   General appearance:  fatigued, with pressured speech  Mental status: alert, oriented to person, place, and time  Skin: warm & dry   Cardiovascular: normal heart rate noted  Respiratory: normal respiratory effort but reports mild SOB; crackles auscultated all lobes on left, lower lobe on right  Abdomen: soft, +edema             Incision:  honeycomb intact, sm old drainage  Extremities: pitting edema up to thighs  No results found for this or  any previous visit (from the past 24 hour(s)).  Assessment & Plan:  1) S/p pLTCS x d ago following failed induction for gHTN  2) Probable pre-e> BPs just under severe range, probable pulm edema  Instructed to go to [facility] immediately for pre-e workup and admission. Needs to see lactation as well for breastfeeding assistance. Sent an order to [place] for breast pump.  Meds:  Meds ordered this encounter  Medications  . Misc. Devices (BREAST PUMP) MISC    Sig: 1 Units by Does not apply route as needed.    Dispense:  1 each    Refill:  0   Has PP incision and BP check for 10/2- will adjust as needed   [place] 12/18/2018 12:29 PM

## 2018-12-18 NOTE — H&P (Signed)
OBSTETRIC ADMISSION HISTORY AND PHYSICAL  Lori Barnes is a 28 y.o. female G1P1001 s/p primary CS 4 days ago presenting with worsening HTN. Hx of gHTN and had worsening BP in office today. Reports HA and blurry vision today. Denies RUQ pain, CP, and SOB. Reports increased lower extremity edema and 20 lb weight gain since delivery.  Prenatal History/Complications: - gHTN - tobacco use - hx LEEP  Past Medical History: Past Medical History:  Diagnosis Date  . Vaginal Pap smear, abnormal     Past Surgical History: Past Surgical History:  Procedure Laterality Date  . CESAREAN SECTION N/A 12/14/2018   Procedure: CESAREAN SECTION;  Surgeon: Tereso NewcomerAnyanwu, Ugonna A, MD;  Location: [facility];  Service: Obstetrics;  Laterality: N/A;  . LEEP    . WISDOM TOOTH EXTRACTION      Obstetrical History: OB History    Gravida  1   Para  1   Term  1   Preterm      AB      Living  1     SAB      TAB      Ectopic      Multiple  0   Live Births  1           Social History: Social History   Socioeconomic History  . Marital status: Single    Spouse name: Not on file  . Number of children: Not on file  . Years of education: Not on file  . Highest education level: Not on file  Occupational History  . Not on file  Social Needs  . Financial resource strain: Not on file  . Food insecurity    Worry: Not on file    Inability: Not on file  . Transportation needs    Medical: Not on file    Non-medical: Not on file  Tobacco Use  . Smoking status: Current Every Day Smoker    Packs/day: 0.50    Years: 6.00    Pack years: 3.00    Types: Cigarettes  . Smokeless tobacco: Never Used  . Tobacco comment: 4-5 cigs per day  Substance and Sexual Activity  . Alcohol use: No  . Drug use: No  . Sexual activity: Yes    Birth control/protection: None, Condom  Lifestyle  . Physical activity    Days per week: Not on file    Minutes per session: Not on file  . Stress: Not on file   Relationships  . Social Musicianconnections    Talks on phone: Not on file    Gets together: Not on file    Attends religious service: Not on file    Active member of club or organization: Not on file    Attends meetings of clubs or organizations: Not on file    Relationship status: Not on file  Other Topics Concern  . Not on file  Social History Narrative  . Not on file    Family History: Family History  Problem Relation Age of Onset  . Hodgkin's lymphoma Mother   . Cancer Father   . Hypertension Maternal Grandmother   . Cancer Paternal Grandfather        oral    Allergies: Allergies  Allergen Reactions  . Latex Other (See Comments)    Blister    Medications Prior to Admission  Medication Sig Dispense Refill Last Dose  . albuterol (VENTOLIN HFA) 108 (90 Base) MCG/ACT inhaler Inhale 2 puffs into the lungs every 4 (  four) hours as needed for wheezing or shortness of breath. 8 g 0 12/18/2018 at Unknown time  . ferrous sulfate 325 (65 FE) MG tablet Take 1 tablet (325 mg total) by mouth 2 (two) times daily with a meal. 60 tablet 0 12/18/2018 at Unknown time  . ibuprofen (ADVIL) 600 MG tablet Take 1 tablet (600 mg total) by mouth every 6 (six) hours as needed. 30 tablet 0 12/18/2018 at Unknown time  . Misc. Devices (BREAST PUMP) MISC 1 Units by Does not apply route as needed. 1 each 0 12/18/2018 at Unknown time  . omeprazole (PRILOSEC) 20 MG capsule Take 1 capsule (20 mg total) by mouth daily. 30 capsule 3 12/18/2018 at Unknown time  . oxyCODONE (OXY IR/ROXICODONE) 5 MG immediate release tablet Take 1-2 tablets (5-10 mg total) by mouth every 4 (four) hours as needed for moderate pain. 30 tablet 0 12/18/2018 at Unknown time  . Prenatal Vit-Fe Fumarate-FA (PRENATAL MULTIVITAMIN) TABS tablet Take 1 tablet by mouth daily at 12 noon.   12/18/2018 at Unknown time  . ondansetron (ZOFRAN) 4 MG tablet TAKE 1 TABLET BY MOUTH EVERY 8 HOURS AS NEEDED FOR NAUSEA AND VOMITING (Patient taking differently: Take  4 mg by mouth every 8 (eight) hours as needed for nausea or vomiting. ) 16 tablet 2 Unknown at Unknown time     Review of Systems  All systems reviewed and negative except as stated in HPI  PE: Blood pressure (!) 165/104, pulse (!) 110, temperature 98.5 F (36.9 C), temperature source Oral, resp. rate 19, SpO2 98 %, unknown if currently breastfeeding. Patient Vitals for the past 24 hrs:  BP Temp Temp src Pulse Resp SpO2  12/18/18 1945 (!) 165/104 - - (!) 110 - -  12/18/18 1931 (!) 155/92 - - 100 - -  12/18/18 1926 (!) 160/88 - - (!) 109 - -  12/18/18 1906 - - - - - 98 %  12/18/18 1905 (!) 173/92 98.5 F (36.9 C) Oral (!) 113 19 -   General appearance: alert, cooperative and no distress Lungs: regular rate and effort, +rales all lobes Heart: regular rate and rhythm Abdomen: soft, non-tender; honeycomb c/d/i to low transverse abdomen Extremities: 3+ pitting edema, no sign of DVT  Results for orders placed or performed during the hospital encounter of 12/18/18 (from the past 24 hour(s))  CBC with Differential/Platelet   Collection Time: 12/18/18  6:57 PM  Result Value Ref Range   WBC 8.1 4.0 - 10.5 K/uL   RBC 2.25 (L) 3.87 - 5.11 MIL/uL   Hemoglobin 7.3 (L) 12.0 - 15.0 g/dL   HCT 21.5 (L) 36.0 - 46.0 %   MCV 95.6 80.0 - 100.0 fL   MCH 32.4 26.0 - 34.0 pg   MCHC 34.0 30.0 - 36.0 g/dL   RDW 13.6 11.5 - 15.5 %   Platelets 209 150 - 400 K/uL   nRBC 0.2 0.0 - 0.2 %   Neutrophils Relative % 67 %   Neutro Abs 5.5 1.7 - 7.7 K/uL   Lymphocytes Relative 22 %   Lymphs Abs 1.8 0.7 - 4.0 K/uL   Monocytes Relative 7 %   Monocytes Absolute 0.5 0.1 - 1.0 K/uL   Eosinophils Relative 2 %   Eosinophils Absolute 0.2 0.0 - 0.5 K/uL   Basophils Relative 0 %   Basophils Absolute 0.0 0.0 - 0.1 K/uL   Immature Granulocytes 2 %   Abs Immature Granulocytes 0.14 (H) 0.00 - 0.07 K/uL  Comprehensive metabolic panel  Collection Time: 12/18/18  6:57 PM  Result Value Ref Range   Sodium 138 135 -  145 mmol/L   Potassium 3.8 3.5 - 5.1 mmol/L   Chloride 105 98 - 111 mmol/L   CO2 23 22 - 32 mmol/L   Glucose, Bld 87 70 - 99 mg/dL   BUN 11 6 - 20 mg/dL   Creatinine, Ser 9.60 0.44 - 1.00 mg/dL   Calcium 8.7 (L) 8.9 - 10.3 mg/dL   Total Protein 5.7 (L) 6.5 - 8.1 g/dL   Albumin 2.5 (L) 3.5 - 5.0 g/dL   AST 35 15 - 41 U/L   ALT 28 0 - 44 U/L   Alkaline Phosphatase 71 38 - 126 U/L   Total Bilirubin 0.5 0.3 - 1.2 mg/dL   GFR calc non Af Amer >60 >60 mL/min   GFR calc Af Amer >60 >60 mL/min   Anion gap 10 5 - 15    Patient Active Problem List   Diagnosis Date Noted  . Failed induction of labor 12/14/2018  . S/P cesarean section for AOD 12/14/2018  . Gestational hypertension 12/11/2018  . Tobacco use complicating pregnancy 12/11/2018  . Supervision of normal first pregnancy 06/05/2018  . S/P LEEP 06/05/2018   Assessment: Severe postpartum preeclampsia Dependent edema  Plan: Admit tp [facility] unit MgSO4 Antihypertensives Lasix Mngt per Dr. Kem Kays, CNM  12/18/2018, 7:46 PM

## 2018-12-18 NOTE — MAU Note (Signed)
Pt reports to MAU c/o increase in BP's that started last Wednesday when she was admitted to have her baby. Pt states on Monday she started swelling really bad and today she was seen in the office and they told her to have fluid in her lungs and her csection was swollen. Pt states she would have come earlier but her doctor told her to be admitted? pt states she has gained 20 lbs since delivery. Pt states she has HA blurry vision and seeing spots.

## 2018-12-19 MED ORDER — SODIUM CHLORIDE 0.9 % IV SOLN
INTRAVENOUS | Status: DC | PRN
  Administered 2018-12-19: 250 mL via INTRAVENOUS

## 2018-12-19 MED ORDER — SENNOSIDES-DOCUSATE SODIUM 8.6-50 MG PO TABS
2.0000 | ORAL_TABLET | Freq: Every day | ORAL | Status: DC
  Administered 2018-12-19 (×2): 2 via ORAL
  Filled 2018-12-19 (×2): qty 2

## 2018-12-19 MED ORDER — ESCITALOPRAM OXALATE 5 MG PO TABS: 5.0000 mg | ORAL_TABLET | Freq: Every day | ORAL | 3 refills | Status: DC

## 2018-12-19 MED ORDER — ESCITALOPRAM OXALATE 10 MG PO TABS
5.0000 mg | ORAL_TABLET | Freq: Every day | ORAL | Status: DC
  Administered 2018-12-19: 5 mg via ORAL
  Filled 2018-12-19: qty 1

## 2018-12-19 MED ORDER — FUROSEMIDE 10 MG/ML IJ SOLN
20.0000 mg | Freq: Two times a day (BID) | INTRAMUSCULAR | Status: AC
  Administered 2018-12-19 – 2018-12-20 (×2): 20 mg via INTRAVENOUS
  Filled 2018-12-19 (×2): qty 2

## 2018-12-19 MED ORDER — POTASSIUM CHLORIDE CRYS ER 20 MEQ PO TBCR
20.0000 meq | EXTENDED_RELEASE_TABLET | Freq: Every day | ORAL | Status: DC
  Administered 2018-12-19 – 2018-12-20 (×2): 20 meq via ORAL
  Filled 2018-12-19 (×2): qty 1

## 2018-12-19 MED ORDER — ZINC OXIDE 11.3 % EX CREA
TOPICAL_CREAM | Freq: Two times a day (BID) | CUTANEOUS | Status: DC
  Administered 2018-12-19 (×2): via TOPICAL
  Filled 2018-12-19: qty 56

## 2018-12-19 MED ORDER — NICOTINE 14 MG/24HR TD PT24
14.0000 mg | MEDICATED_PATCH | Freq: Every day | TRANSDERMAL | Status: DC
  Administered 2018-12-19: 14 mg via TRANSDERMAL
  Filled 2018-12-19: qty 1

## 2018-12-19 MED ORDER — ZINC OXIDE 11.3 % EX CREA: TOPICAL_CREAM | CUTANEOUS | 1 refills | Status: DC

## 2018-12-19 MED ORDER — AMLODIPINE BESYLATE 5 MG PO TABS: 5.0000 mg | ORAL_TABLET | Freq: Every day | ORAL | 2 refills | Status: DC

## 2018-12-19 MED ORDER — IBUPROFEN 800 MG PO TABS
800.0000 mg | ORAL_TABLET | Freq: Four times a day (QID) | ORAL | Status: DC | PRN
  Administered 2018-12-19 (×2): 800 mg via ORAL
  Filled 2018-12-19 (×2): qty 1

## 2018-12-19 MED ORDER — ACETAMINOPHEN 500 MG PO TABS
1000.0000 mg | ORAL_TABLET | Freq: Four times a day (QID) | ORAL | Status: DC | PRN
  Administered 2018-12-19: 1000 mg via ORAL
  Filled 2018-12-19: qty 2

## 2018-12-19 MED ORDER — SODIUM CHLORIDE 0.9 % IV SOLN
510.0000 mg | Freq: Once | INTRAVENOUS | Status: AC
  Administered 2018-12-19: 510 mg via INTRAVENOUS
  Filled 2018-12-19: qty 17

## 2018-12-19 NOTE — Discharge Summary (Signed)
Physician Discharge Summary  Patient ID: Lori Barnes MRN: [id number] DOB/AGE: [birth year] 28 y.o.  Admit date: 12/18/2018 Discharge date: 12/19/2018  Admission Diagnoses: postpartum pre eclampsia  Discharge Diagnoses: same + postpartum depression Active Problems:   Severe pre-eclampsia, postpartum   Discharged Condition: good  Hospital Course: She was admitted from the MAU with severe swelling and shortness of breath. A CRX was normal but she had severe pedal edema. Her BPs were elevated and she was started on norvasc 5 mg daily. She was given 4 doses of IV lasix. Her BPs and her ease of breathing both improved greatly. Due to her anemia, she was given an infusion of fereheme. She was given 24 hours of magnesium and was ready for discharge the following morning.  Consults: None  Significant Diagnostic Studies: labs: hbg 7.4  Treatments: IV lasix, magnesium, and iron  Discharge Exam: Blood pressure 138/76, pulse 96, temperature 98 F (36.7 C), temperature source Oral, resp. rate 18, height 5\' 1"  (1.549 m), weight 92.8 kg, SpO2 99 %, unknown if currently breastfeeding. General appearance: alert  Heart- rrr Lungs- ronchi throughout 4+ pedal edema  Disposition: home There are no questions and answers to display.          Follow-up Information    [facility]. Schedule an appointment as soon as possible for a visit in 1 week(s).   Why: BP check Contact information: [place] [id number] [phone]          Signed: Emily Filbert 12/19/2018, 3:36 PM

## 2018-12-19 NOTE — Plan of Care (Signed)
  Problem: Pain Managment: Goal: General experience of comfort will improve Outcome: Progressing   

## 2018-12-19 NOTE — Plan of Care (Signed)
  Problem: Clinical Measurements: Goal: Respiratory complications will improve Outcome: Not Progressing   Problem: Clinical Measurements: Goal: Cardiovascular complication will be avoided Outcome: Progressing  Pt. Using IS regularly. Taught the importance of regular use to pt (return demo). Pt. To start lexapro tonight for post partum depression symptoms she expressed. Ibuprofen and tylenol helping with PRN oxy for pain needs. Will monitor.

## 2018-12-19 NOTE — Discharge Instructions (Signed)
Preeclampsia and Eclampsia °Preeclampsia is a serious condition that may develop during pregnancy. This condition causes high blood pressure and increased protein in your urine along with other symptoms, such as headaches and vision changes. These symptoms may develop as the condition gets worse. Preeclampsia may occur at 20 weeks of pregnancy or later. °Diagnosing and treating preeclampsia early is very important. If not treated early, it can cause serious problems for you and your baby. One problem it can lead to is eclampsia. Eclampsia is a condition that causes muscle jerking or shaking (convulsions or seizures) and other serious problems for the mother. During pregnancy, delivering your baby may be the best treatment for preeclampsia or eclampsia. For most women, preeclampsia and eclampsia symptoms go away after giving birth. °In rare cases, a woman may develop preeclampsia after giving birth (postpartum preeclampsia). This usually occurs within 48 hours after childbirth but may occur up to 6 weeks after giving birth. °What are the causes? °The cause of preeclampsia is not known. °What increases the risk? °The following risk factors make you more likely to develop preeclampsia: °· Being pregnant for the first time. °· Having had preeclampsia during a past pregnancy. °· Having a family history of preeclampsia. °· Having high blood pressure. °· Being pregnant with more than one baby. °· Being 35 or older. °· Being African-American. °· Having kidney disease or diabetes. °· Having medical conditions such as lupus or blood diseases. °· Being very overweight (obese). °What are the signs or symptoms? °The most common symptoms are: °· Severe headaches. °· Vision problems, such as blurred or double vision. °· Abdominal pain, especially upper abdominal pain. °Other symptoms that may develop as the condition gets worse include: °· Sudden weight gain. °· Sudden swelling of the hands, face, legs, and feet. °· Severe nausea  and vomiting. °· Numbness in the face, arms, legs, and feet. °· Dizziness. °· Urinating less than usual. °· Slurred speech. °· Convulsions or seizures. °How is this diagnosed? °There are no screening tests for preeclampsia. Your health care provider will ask you about symptoms and check for signs of preeclampsia during your prenatal visits. You may also have tests that include: °· Checking your blood pressure. °· Urine tests to check for protein. Your health care provider will check for this at every prenatal visit. °· Blood tests. °· Monitoring your baby's heart rate. °· Ultrasound. °How is this treated? °You and your health care provider will determine the treatment approach that is best for you. Treatment may include: °· Having more frequent prenatal exams to check for signs of preeclampsia, if you have an increased risk for preeclampsia. °· Medicine to lower your blood pressure. °· Staying in the hospital, if your condition is severe. There, treatment will focus on controlling your blood pressure and the amount of fluids in your body (fluid retention). °· Taking medicine (magnesium sulfate) to prevent seizures. This may be given as an injection or through an IV. °· Taking a low-dose aspirin during your pregnancy. °· Delivering your baby early. You may have your labor started with medicine (induced), or you may have a cesarean delivery. °Follow these instructions at home: °Eating and drinking ° °· Drink enough fluid to keep your urine pale yellow. °· Avoid caffeine. °Lifestyle °· Do not use any products that contain nicotine or tobacco, such as cigarettes and e-cigarettes. If you need help quitting, ask your health care provider. °· Do not use alcohol or drugs. °· Avoid stress as much as possible. Rest and get   plenty of sleep. °General instructions °· Take over-the-counter and prescription medicines only as told by your health care provider. °· When lying down, lie on your left side. This keeps pressure off your  major blood vessels. °· When sitting or lying down, raise (elevate) your feet. Try putting some pillows underneath your lower legs. °· Exercise regularly. Ask your health care provider what kinds of exercise are best for you. °· Keep all follow-up and prenatal visits as told by your health care provider. This is important. °How is this prevented? °There is no known way of preventing preeclampsia or eclampsia from developing. However, to lower your risk of complications and detect problems early: °· Get regular prenatal care. Your health care provider may be able to diagnose and treat the condition early. °· Maintain a healthy weight. Ask your health care provider for help managing weight gain during pregnancy. °· Work with your health care provider to manage any long-term (chronic) health conditions you have, such as diabetes or kidney problems. °· You may have tests of your blood pressure and kidney function after giving birth. °· Your health care provider may have you take low-dose aspirin during your next pregnancy. °Contact a health care provider if: °· You have symptoms that your health care provider told you may require more treatment or monitoring, such as: °? Headaches. °? Nausea or vomiting. °? Abdominal pain. °? Dizziness. °? Light-headedness. °Get help right away if: °· You have severe: °? Abdominal pain. °? Headaches that do not get better. °? Dizziness. °? Vision problems. °? Confusion. °? Nausea or vomiting. °· You have any of the following: °? A seizure. °? Sudden, rapid weight gain. °? Sudden swelling in your hands, ankles, or face. °? Trouble moving any part of your body. °? Numbness in any part of your body. °? Trouble speaking. °? Abnormal bleeding. °· You faint. °Summary °· Preeclampsia is a serious condition that may develop during pregnancy. °· This condition causes high blood pressure and increased protein in your urine along with other symptoms, such as headaches and vision  changes. °· Diagnosing and treating preeclampsia early is very important. If not treated early, it can cause serious problems for you and your baby. °· Get help right away if you have symptoms that your health care provider told you to watch for. °This information is not intended to replace advice given to you by your health care provider. Make sure you discuss any questions you have with your health care provider. °Document Released: 03/03/2000 Document Revised: 11/06/2017 Document Reviewed: 10/11/2015 °Elsevier Patient Education © 2020 [employer] ° °

## 2018-12-19 NOTE — Progress Notes (Signed)
Patient ID: Lori Barnes, female   DOB: October 30, 1990, 28 y.o.   MRN: [id number] HD #2  POD # 5 (arrest of progress)  S. She is having "crying spells" since yesterday. She was given a diagnosis of "bipolar depression" as a teen when her father died. She too zoloft briefly. She denies HI, SI.  O. VSS, AF Heart- rrr Lungs- ronchi through out Abd- benign Honeycomb dressing- clean 4+ pedal edema Skin ulceration from her time in the hospital  A/P. Skin ulceration- zinc oxide prescribed Pain management- IBU and tylenol added to the oxycodone 5 mg Postpartum pre eclampsia- on magnesium sulfate Postpartum depression- start lexapro 5 mg qhs Anemia (7.4 hbg)- fereheme ordered

## 2018-12-20 ENCOUNTER — Encounter: Payer: Medicaid Other | Admitting: Obstetrics & Gynecology

## 2018-12-20 ENCOUNTER — Encounter (HOSPITAL_COMMUNITY): Payer: Self-pay | Admitting: Obstetrics & Gynecology

## 2018-12-20 DIAGNOSIS — R87629 Unspecified abnormal cytological findings in specimens from vagina: Secondary | ICD-10-CM | POA: Diagnosis present

## 2018-12-20 DIAGNOSIS — O9081 Anemia of the puerperium: Secondary | ICD-10-CM | POA: Diagnosis present

## 2018-12-20 MED ORDER — ZINC OXIDE 11.3 % EX CREA: TOPICAL_CREAM | CUTANEOUS | 1 refills | Status: DC

## 2018-12-20 MED ORDER — FUROSEMIDE 20 MG PO TABS: 20.0000 mg | ORAL_TABLET | Freq: Every day | ORAL | 0 refills | Status: DC

## 2018-12-20 MED ORDER — ESCITALOPRAM OXALATE 5 MG PO TABS: 5.0000 mg | ORAL_TABLET | Freq: Every day | ORAL | 3 refills | Status: DC

## 2018-12-20 MED ORDER — AMLODIPINE BESYLATE 10 MG PO TABS: 10.0000 mg | ORAL_TABLET | Freq: Every day | ORAL | 1 refills | Status: DC

## 2018-12-20 MED ORDER — OXYCODONE HCL 5 MG PO TABS: 5.0000 mg | ORAL_TABLET | ORAL | 0 refills | Status: DC | PRN

## 2018-12-20 MED ORDER — AMLODIPINE BESYLATE 10 MG PO TABS
10.0000 mg | ORAL_TABLET | Freq: Every day | ORAL | Status: DC
  Administered 2018-12-20: 10 mg via ORAL
  Filled 2018-12-20: qty 1

## 2018-12-20 MED ORDER — SENNOSIDES-DOCUSATE SODIUM 8.6-50 MG PO TABS: 2.0000 | ORAL_TABLET | Freq: Every evening | ORAL | 2 refills | Status: DC | PRN

## 2018-12-20 MED ORDER — POTASSIUM CHLORIDE CRYS ER 20 MEQ PO TBCR: 20.0000 meq | EXTENDED_RELEASE_TABLET | Freq: Every day | ORAL | 1 refills | Status: DC

## 2018-12-20 MED FILL — BALMEX COMPLETE PROTECTION: 11.3 | 56 days supply | Qty: 56 | Fill #0

## 2018-12-20 MED FILL — SENNA S 8.6-50 MG TABS: 8.6-50 | 15 days supply | Qty: 30 | Fill #0

## 2018-12-20 MED FILL — oxyCODONE HCL 5 MG TABS: 5 | 5 days supply | Qty: 20 | Fill #0

## 2018-12-20 MED FILL — ESCITALOPRAM 10 MG TABLET: 10 | 30 days supply | Qty: 15 | Fill #0

## 2018-12-20 MED FILL — AMLODIPINE BESYLATE 10 MG T: 10 | 30 days supply | Qty: 30 | Fill #0

## 2018-12-20 MED FILL — POTASSIUM CL ER 20 MEQ TABL: 20 | 5 days supply | Qty: 5 | Fill #0

## 2018-12-20 MED FILL — FUROSEMIDE 20 MG TAB: 20 | 5 days supply | Qty: 5 | Fill #0

## 2018-12-20 NOTE — Progress Notes (Signed)
Pt with wound on left lower buttock.  Approx 0.25in x 4 inches long.  Now with scab.  Pt says this occurred during her previous admission and was d/t her sitting on her foley catheter.  She says it is "way better" today than before.  Pt has zinc oxide at bedside and applies to area PRN.

## 2018-12-20 NOTE — Progress Notes (Signed)
Pt discharged to home with sister.  Condition stable.  [facility] pharmacy delivered all new prescriptions to patient prior to discharge.  Discharge instructions included review of preeclampsia symptoms and how to seek care if she experiences any of the symptoms listed on the handout.  No equipment for home ordered at discharge.

## 2018-12-20 NOTE — Discharge Summary (Signed)
Physician Discharge Summary  Patient ID: Lori Barnes MRN: [id number] DOB/AGE: Nov 18, 1990 28 y.o.  Admit date: 12/18/2018 Discharge date: 12/20/2018  Admission Diagnoses and Discharge Diagnoses:  Principal Problem:   Severe pre-eclampsia, postpartum Active Problems:   S/P cesarean section for AOD   Postpartum anemia   Vaginal Pap smear, abnormal  Discharged Condition: Stable  Hospital Course: Patient was re-admitted for postpartum severe preeclamspia after being discharged earlier in the week; she underwent cesarean delivery on 12/14/18. No BP issues antepartum and intrapartum. Once she was diagnosed, she was given magnesium sulfate for eclampsia prophylaxis and antihypertensive medications. BP stabilized on oral Norvasc 10 mg daily. There was concern about her significant peripheral edema and possible pleural effusion, but she had a negative CXR and responded well to Lasix administration.  Patient also noted to be anemic, received Feraheme.  She also was noted to be depressed, had toelrated Lexapro in the past and this was re-started for her.   Of note, patient sustained ulceration on her back and upper legs from previous admission consistent with decubitus injuries, was treated with zinc oxide.  On 12/20/18, she was stable to be discharged to home with close outpatient follow up.   Significant Diagnostic Studies:  CMP Latest Ref Rng & Units 12/18/2018 12/15/2018 12/11/2018  Glucose 70 - 99 mg/dL 87 107(H) 93  BUN 6 - 20 mg/dL 11 5(L) 5(L)  Creatinine 0.44 - 1.00 mg/dL 0.75 0.76 0.70  Sodium 135 - 145 mmol/L 138 136 134(L)  Potassium 3.5 - 5.1 mmol/L 3.8 4.5 3.5  Chloride 98 - 111 mmol/L 105 108 105  CO2 22 - 32 mmol/L 23 20(L) 20(L)  Calcium 8.9 - 10.3 mg/dL 8.7(L) 8.4(L) 8.6(L)  Total Protein 6.5 - 8.1 g/dL 5.7(L) 4.8(L) 6.1(L)  Total Bilirubin 0.3 - 1.2 mg/dL 0.5 0.1(L) 0.4  Alkaline Phos 38 - 126 U/L 71 84 129(H)  AST 15 - 41 U/L 35 28 22  ALT 0 - 44 U/L 28 12 13    CBC Latest Ref  Rng & Units 12/18/2018 12/15/2018 12/12/2018  WBC 4.0 - 10.5 K/uL 8.1 15.8(H) 12.8(H)  Hemoglobin 12.0 - 15.0 g/dL 7.3(L) 7.4(L) 11.1(L)  Hematocrit 36.0 - 46.0 % 21.5(L) 22.2(L) 32.5(L)  Platelets 150 - 400 K/uL 209 118(L) 140(L)   Dg Chest 2 View  Result Date: 12/18/2018 CLINICAL DATA:  Initial evaluation for worsening hypertension, recent C-section. EXAM: CHEST - 2 VIEW COMPARISON:  Prior radiograph from 12/15/2018. FINDINGS: The cardiac and mediastinal silhouettes are stable in size and contour, and remain within normal limits. The lungs are normally inflated. No airspace consolidation, pleural effusion, or pulmonary edema. No pneumothorax. No acute osseous abnormality. IMPRESSION: Stable and normal appearance of the chest. No active cardiopulmonary disease identified. Electronically Signed   By: Jeannine Boga M.D.   On: 12/18/2018 22:10   Dg Chest 2 View  Result Date: 12/15/2018 CLINICAL DATA:  Infection and cough after c-section EXAM: CHEST - 2 VIEW COMPARISON:  09/03/2014 FINDINGS: Normal mediastinum and cardiac silhouette. Normal pulmonary vasculature. No evidence of effusion, infiltrate, or pneumothorax. No acute bony abnormality. IMPRESSION: Normal chest radiograph Electronically Signed   By: Suzy Bouchard M.D.   On: 12/15/2018 09:45   Discharge Exam: Blood pressure 138/76, pulse 94, temperature 97.7 F (36.5 C), temperature source Oral, resp. rate 18, height 5\' 1"  (1.549 m), weight 92.8 kg, SpO2 98 %, unknown if currently breastfeeding. General appearance: alert and no distress Resp: clear to auscultation bilaterally Cardio: regular rate and rhythm GI: soft, non-tender;  bowel sounds normal; no masses,  no organomegaly Extremities: Homans sign is negative, no sign of DVT, 2+ edema   Discharge disposition: 01-Home or Self Care      Allergies as of 12/20/2018      Reactions   Latex Other (See Comments)   Blister      Medication List    STOP taking these medications    ondansetron 4 MG tablet Commonly known as: ZOFRAN     TAKE these medications   albuterol 108 (90 Base) MCG/ACT inhaler Commonly known as: VENTOLIN HFA Inhale 2 puffs into the lungs every 4 (four) hours as needed for wheezing or shortness of breath.   amLODipine 10 MG tablet Commonly known as: NORVASC Take 1 tablet (10 mg total) by mouth daily.   Breast Pump Misc 1 Units by Does not apply route as needed.   escitalopram 5 MG tablet Commonly known as: LEXAPRO Take 1 tablet (5 mg total) by mouth at bedtime.   ferrous sulfate 325 (65 FE) MG tablet Take 1 tablet (325 mg total) by mouth 2 (two) times daily with a meal.   furosemide 20 MG tablet Commonly known as: Lasix Take 1 tablet (20 mg total) by mouth daily for 5 doses.   ibuprofen 600 MG tablet Commonly known as: ADVIL Take 1 tablet (600 mg total) by mouth every 6 (six) hours as needed.   omeprazole 20 MG capsule Commonly known as: PriLOSEC Take 1 capsule (20 mg total) by mouth daily.   oxyCODONE 5 MG immediate release tablet Commonly known as: Oxy IR/ROXICODONE Take 1 tablet (5 mg total) by mouth every 4 (four) hours as needed for moderate pain. What changed: how much to take   potassium chloride SA 20 MEQ tablet Commonly known as: KLOR-CON Take 1 tablet (20 mEq total) by mouth daily for 5 days.   prenatal multivitamin Tabs tablet Take 1 tablet by mouth daily at 12 noon.   senna-docusate 8.6-50 MG tablet Commonly known as: Senokot-S Take 2 tablets by mouth at bedtime as needed for mild constipation or moderate constipation.   zinc oxide 11.3 % Crea cream Commonly known as: BALMEX Apply to affected area daily      Follow-up Information    [facility]. Schedule an appointment as soon as possible for a visit in 1 week(s).   Why: BP check Contact information: [address] [place] [address] [phone]          Signed: Jaynie Collins, MD 12/20/2018, 7:54 AM

## 2018-12-23 DIAGNOSIS — H5203 Hypermetropia, bilateral: Secondary | ICD-10-CM | POA: Diagnosis not present

## 2018-12-26 ENCOUNTER — Other Ambulatory Visit: Payer: Self-pay

## 2018-12-26 ENCOUNTER — Ambulatory Visit (INDEPENDENT_AMBULATORY_CARE_PROVIDER_SITE_OTHER): Payer: Medicaid Other | Admitting: Obstetrics and Gynecology

## 2018-12-26 ENCOUNTER — Encounter: Payer: Self-pay | Admitting: Obstetrics and Gynecology

## 2018-12-26 DIAGNOSIS — Z9889 Other specified postprocedural states: Secondary | ICD-10-CM

## 2018-12-26 MED ORDER — IBUPROFEN 800 MG PO TABS: 800.0000 mg | ORAL_TABLET | Freq: Three times a day (TID) | ORAL | 1 refills | Status: DC | PRN

## 2018-12-26 NOTE — Progress Notes (Signed)
Lori Barnes is here for BP and incision check [facility] on 12/14/18 Readmitted for [facility] Magnesium x 24 hrs and started on Norvasc  Pt denies HA or visual changes today Tolerating diet. Denies bowel or bladder dysfunction Good pain control Bottle feeding  PE AF VSS Lungs clear Heart RRR Abd soft + BS incision healing well no S/Sx of infection  A/P Post op incision check        HTN        Anemia        Depression  Doing well. Pt instruction on wound care. Continue with Norvasc, PNV, iron and antidepressant medications. Refill Motrin. Increase ADL's as tolerates. PP visit in 2 weeks

## 2018-12-26 NOTE — Patient Instructions (Signed)

## 2019-01-03 ENCOUNTER — Telehealth: Payer: Self-pay | Admitting: Physician Assistant

## 2019-01-06 ENCOUNTER — Encounter: Payer: Self-pay | Admitting: Physician Assistant

## 2019-01-06 ENCOUNTER — Other Ambulatory Visit: Payer: Self-pay

## 2019-01-06 ENCOUNTER — Emergency Department (HOSPITAL_COMMUNITY)
Admission: EM | Admit: 2019-01-06 | Discharge: 2019-01-06 | Disposition: A | Discharge status: Home / Self Care | Payer: Medicaid Other | Attending: Emergency Medicine | Admitting: Emergency Medicine

## 2019-01-06 ENCOUNTER — Encounter (HOSPITAL_COMMUNITY): Payer: Self-pay | Admitting: *Deleted

## 2019-01-06 ENCOUNTER — Ambulatory Visit: Payer: Medicaid Other | Admitting: Physician Assistant

## 2019-01-06 VITALS — BP 114/71 | HR 98 | Temp 98.6°F | Ht 61.0 in | Wt 174.6 lb

## 2019-01-06 DIAGNOSIS — T8131XA Disruption of external operation (surgical) wound, not elsewhere classified, initial encounter: Secondary | ICD-10-CM | POA: Diagnosis not present

## 2019-01-06 DIAGNOSIS — Z79899 Other long term (current) drug therapy: Secondary | ICD-10-CM | POA: Diagnosis not present

## 2019-01-06 DIAGNOSIS — R102 Pelvic and perineal pain: Secondary | ICD-10-CM

## 2019-01-06 DIAGNOSIS — L7682 Other postprocedural complications of skin and subcutaneous tissue: Secondary | ICD-10-CM | POA: Insufficient documentation

## 2019-01-06 DIAGNOSIS — N938 Other specified abnormal uterine and vaginal bleeding: Secondary | ICD-10-CM

## 2019-01-06 DIAGNOSIS — F17210 Nicotine dependence, cigarettes, uncomplicated: Secondary | ICD-10-CM | POA: Diagnosis not present

## 2019-01-06 DIAGNOSIS — D509 Iron deficiency anemia, unspecified: Secondary | ICD-10-CM | POA: Diagnosis not present

## 2019-01-06 DIAGNOSIS — R109 Unspecified abdominal pain: Secondary | ICD-10-CM | POA: Diagnosis present

## 2019-01-06 LAB — COMPREHENSIVE METABOLIC PANEL
ALT: 15 U/L (ref 0–44)
AST: 16 U/L (ref 15–41)
Albumin: 3.9 g/dL (ref 3.5–5.0)
Alkaline Phosphatase: 78 U/L (ref 38–126)
Anion gap: 8 (ref 5–15)
BUN: 10 mg/dL (ref 6–20)
CO2: 24 mmol/L (ref 22–32)
Calcium: 8.8 mg/dL — ABNORMAL LOW (ref 8.9–10.3)
Chloride: 106 mmol/L (ref 98–111)
Creatinine, Ser: 0.83 mg/dL (ref 0.44–1.00)
GFR calc Af Amer: >60 mL/min (ref >60)
GFR calc non Af Amer: >60 mL/min (ref >60)
Glucose, Bld: 79 mg/dL (ref 70–99)
Potassium: 3.4 mmol/L — ABNORMAL LOW (ref 3.5–5.1)
Sodium: 138 mmol/L (ref 135–145)
Total Bilirubin: 0.4 mg/dL (ref 0.3–1.2)
Total Protein: 6.8 g/dL (ref 6.5–8.1)

## 2019-01-06 LAB — CBC WITH DIFFERENTIAL/PLATELET
Abs Immature Granulocytes: 0.02 10*3/uL (ref 0.00–0.07)
Basophils Absolute: 0 10*3/uL (ref 0.0–0.1)
Basophils Relative: 1 %
Eosinophils Absolute: 0.1 10*3/uL (ref 0.0–0.5)
Eosinophils Relative: 2 %
HCT: 33.6 % — ABNORMAL LOW (ref 36.0–46.0)
Hemoglobin: 10.4 g/dL — ABNORMAL LOW (ref 12.0–15.0)
Immature Granulocytes: 0 %
Lymphocytes Relative: 26 %
Lymphs Abs: 1.7 10*3/uL (ref 0.7–4.0)
MCH: 30.9 pg (ref 26.0–34.0)
MCHC: 31 g/dL (ref 30.0–36.0)
MCV: 99.7 fL (ref 80.0–100.0)
Monocytes Absolute: 0.5 10*3/uL (ref 0.1–1.0)
Monocytes Relative: 8 %
Neutro Abs: 4.1 10*3/uL (ref 1.7–7.7)
Neutrophils Relative %: 63 %
Platelets: 282 10*3/uL (ref 150–400)
RBC: 3.37 MIL/uL — ABNORMAL LOW (ref 3.87–5.11)
RDW: 14.6 % (ref 11.5–15.5)
WBC: 6.4 10*3/uL (ref 4.0–10.5)
nRBC: 0 % (ref 0.0–0.2)

## 2019-01-06 LAB — LIPASE, BLOOD: Lipase: 18 U/L (ref 11–51)

## 2019-01-06 LAB — SAMPLE TO BLOOD BANK

## 2019-01-06 NOTE — ED Triage Notes (Signed)
C/o pain in abdominal area after c-section, states she is bleeding heavily, c section 12/14/2018

## 2019-01-06 NOTE — Discharge Instructions (Signed)
Your exam and labs today are reassuring, especially with your increasing hemoglobin.  Keep your appointment with [facility] as you have scheduled. In the interim, get rechecked sooner (return here) if you develop increasing weakness, lightheadedness or worsening abdominal pain.  Your surgical site appears healthy and is healing well.

## 2019-01-06 NOTE — Progress Notes (Signed)
BP 114/71   Pulse 98   Temp 98.6 F (37 C) (Temporal)   Ht 5\' 1"  (1.549 m)   Wt 174 lb 9.6 oz (79.2 kg)   SpO2 99%   BMI 32.99 kg/m    Subjective:    Patient ID: Lori Barnes, female    DOB: 1990-11-15, 28 y.o.   MRN: [id number]  HPI: Lori Barnes is a 28 y.o. female presenting on 01/06/2019 for New Patient (Initial Visit)  Has used an entire pack of 12-hour pads in the past 3 days.  She states that the packing fell up and about 30 minutes.  It is bright red blood.  In reviewing her chart she had a hemoglobin of about 11 when she was admitted and 7.3 some days after her C-section.  At this point she is having worsening pain that goes across her lower abdomen at the scar with some changes in the skin and then deep pain inside her pelvis.  I have recommended that she go to the emergency room for immediate labs and possible radiology.   Depression screen [facility] 2/9 01/06/2019 06/05/2018  Decreased Interest 0 0  Down, Depressed, Hopeless 0 0  PHQ - 2 Score 0 0  Altered sleeping - 0  Tired, decreased energy - 1  Change in appetite - 0  Feeling bad or failure about yourself  - 0  Trouble concentrating - 0  Moving slowly or fidgety/restless - 0  Suicidal thoughts - 0  PHQ-9 Score - 1     Past Medical History:  Diagnosis Date  . Depression   . Gestational hypertension 12/11/2018  . Vaginal Pap smear, abnormal    Relevant past medical, surgical, family and social history reviewed and updated as indicated. Interim medical history since our last visit reviewed. Allergies and medications reviewed and updated. DATA REVIEWED: CHART IN EPIC  Family History reviewed for pertinent findings.  Review of Systems  Constitutional: Positive for fatigue. Negative for activity change, fever and unexpected weight change.  HENT: Negative.   Eyes: Negative.   Respiratory: Negative.  Negative for cough.   Cardiovascular: Negative.  Negative for chest pain.  Gastrointestinal: Positive for  abdominal distention and abdominal pain.  Endocrine: Negative.   Genitourinary: Positive for pelvic pain and vaginal bleeding. Negative for dysuria.  Musculoskeletal: Negative.   Skin: Negative.   Neurological: Negative.     Allergies as of 01/06/2019      Reactions   Latex Other (See Comments)   Blister      Medication List       Accurate as of January 06, 2019 10:32 AM. If you have any questions, ask your nurse or doctor.        albuterol 108 (90 Base) MCG/ACT inhaler Commonly known as: VENTOLIN HFA Inhale 2 puffs into the lungs every 4 (four) hours as needed for wheezing or shortness of breath.   amLODipine 10 MG tablet Commonly known as: NORVASC Take 1 tablet (10 mg total) by mouth daily.   escitalopram 5 MG tablet Commonly known as: LEXAPRO Take 1 tablet (5 mg total) by mouth at bedtime.   ferrous sulfate 325 (65 FE) MG tablet Take 1 tablet (325 mg total) by mouth 2 (two) times daily with a meal.   ibuprofen 800 MG tablet Commonly known as: ADVIL Take 1 tablet (800 mg total) by mouth 3 (three) times daily with meals as needed for headache or moderate pain.   ondansetron 4 MG tablet Commonly known as:  ZOFRAN Take 4 mg by mouth every 8 (eight) hours as needed for nausea or vomiting.   oxyCODONE 5 MG immediate release tablet Commonly known as: Oxy IR/ROXICODONE Take 1 tablet (5 mg total) by mouth every 4 (four) hours as needed for moderate pain.   prenatal multivitamin Tabs tablet Take 1 tablet by mouth daily at 12 noon.   senna-docusate 8.6-50 MG tablet Commonly known as: Senokot-S Take 2 tablets by mouth at bedtime as needed for mild constipation or moderate constipation.   zinc oxide 11.3 % Crea cream Commonly known as: BALMEX Apply to affected area daily          Objective:    BP 114/71   Pulse 98   Temp 98.6 F (37 C) (Temporal)   Ht 5\' 1"  (1.549 m)   Wt 174 lb 9.6 oz (79.2 kg)   SpO2 99%   BMI 32.99 kg/m   Allergies  Allergen  Reactions  . Latex Other (See Comments)    Blister    Wt Readings from Last 3 Encounters:  01/06/19 174 lb 9.6 oz (79.2 kg)  12/26/18 175 lb 6.4 oz (79.6 kg)  12/18/18 204 lb 9.6 oz (92.8 kg)    Physical Exam Constitutional:      General: She is not in acute distress.    Appearance: Normal appearance. She is well-developed.  HENT:     Head: Normocephalic and atraumatic.  Cardiovascular:     Rate and Rhythm: Normal rate.  Pulmonary:     Effort: Pulmonary effort is normal.  Abdominal:       Comments: swelling and raised area on c-section scar, no warmth and drainage  Skin:    General: Skin is warm and dry.     Findings: No rash.  Neurological:     Mental Status: She is alert and oriented to person, place, and time.     Deep Tendon Reflexes: Reflexes are normal and symmetric.     Results for orders placed or performed during the hospital encounter of 12/18/18  CBC with Differential/Platelet  Result Value Ref Range   WBC 8.1 4.0 - 10.5 K/uL   RBC 2.25 (L) 3.87 - 5.11 MIL/uL   Hemoglobin 7.3 (L) 12.0 - 15.0 g/dL   HCT 12/20/18 (L) 69.6 - 29.5 %   MCV 95.6 80.0 - 100.0 fL   MCH 32.4 26.0 - 34.0 pg   MCHC 34.0 30.0 - 36.0 g/dL   RDW 28.4 13.2 - 44.0 %   Platelets 209 150 - 400 K/uL   nRBC 0.2 0.0 - 0.2 %   Neutrophils Relative % 67 %   Neutro Abs 5.5 1.7 - 7.7 K/uL   Lymphocytes Relative 22 %   Lymphs Abs 1.8 0.7 - 4.0 K/uL   Monocytes Relative 7 %   Monocytes Absolute 0.5 0.1 - 1.0 K/uL   Eosinophils Relative 2 %   Eosinophils Absolute 0.2 0.0 - 0.5 K/uL   Basophils Relative 0 %   Basophils Absolute 0.0 0.0 - 0.1 K/uL   Immature Granulocytes 2 %   Abs Immature Granulocytes 0.14 (H) 0.00 - 0.07 K/uL  Comprehensive metabolic panel  Result Value Ref Range   Sodium 138 135 - 145 mmol/L   Potassium 3.8 3.5 - 5.1 mmol/L   Chloride 105 98 - 111 mmol/L   CO2 23 22 - 32 mmol/L   Glucose, Bld 87 70 - 99 mg/dL   BUN 11 6 - 20 mg/dL   Creatinine, Ser 10.2 0.44 - 1.00 mg/dL  Calcium 8.7 (L) 8.9 - 10.3 mg/dL   Total Protein 5.7 (L) 6.5 - 8.1 g/dL   Albumin 2.5 (L) 3.5 - 5.0 g/dL   AST 35 15 - 41 U/L   ALT 28 0 - 44 U/L   Alkaline Phosphatase 71 38 - 126 U/L   Total Bilirubin 0.5 0.3 - 1.2 mg/dL   GFR calc non Af Amer >60 >60 mL/min   GFR calc Af Amer >60 >60 mL/min   Anion gap 10 5 - 15  Urinalysis, Routine w reflex microscopic  Result Value Ref Range   Color, Urine STRAW (A) YELLOW   APPearance HAZY (A) CLEAR   Specific Gravity, Urine 1.011 1.005 - 1.030   pH 7.0 5.0 - 8.0   Glucose, UA NEGATIVE NEGATIVE mg/dL   Hgb urine dipstick LARGE (A) NEGATIVE   Bilirubin Urine NEGATIVE NEGATIVE   Ketones, ur NEGATIVE NEGATIVE mg/dL   Protein, ur NEGATIVE NEGATIVE mg/dL   Nitrite NEGATIVE NEGATIVE   Leukocytes,Ua TRACE (A) NEGATIVE   RBC / HPF >50 (H) 0 - 5 RBC/hpf   WBC, UA 0-5 0 - 5 WBC/hpf   Bacteria, UA RARE (A) NONE SEEN   Mucus PRESENT       Assessment & Plan:   There are no diagnoses linked to this encounter.  Continue all other maintenance medications as listed above.  Follow up plan: .1. DUB (dysfunctional uterine bleeding) ED now  2. Postpartum care following cesarean delivery ED now  3. Iron deficiency anemia, unspecified iron deficiency anemia type ED now  4. Pelvic pain ED now   Educational handout given for survey  Remus LofflerAngel S. Deshay Kirstein PA-C [facility] [address]  [place], [place] [id number] [phone]   01/06/2019, 10:32 AM

## 2019-01-06 NOTE — ED Notes (Signed)
States she took her last pain medication today and was sent her by her doctor for further evaluation

## 2019-01-06 NOTE — ED Provider Notes (Signed)
[facility] EMERGENCY DEPARTMENT Provider Note   CSN: [id number] Arrival date & time: 01/06/19  1319     History   Chief Complaint Chief Complaint  Patient presents with  . Abdominal Pain    HPI Lori Barnes is a 28 y.o. female presenting with complaint of painful nodules noted along her c section line, with currently being 23 days post partum  which she first noted several days ago in association with heavy vaginal bleeding x 3 days.  She was readmitted at [facility] for complications of post partum pre-eclampsia which has improved, is still taking norvasc for blood pressure control.  She has had to change her pad every 30 minutes since yesterday, reporting passage of bright red blood.  She denies sexual intercourse since her delivery.  She denies weakness or dizziness, also no n/v, sob, chest pain, no dysuria.       The history is provided by the patient.    Past Medical History:  Diagnosis Date  . Depression   . Gestational hypertension 12/11/2018  . Vaginal Pap smear, abnormal     Patient Active Problem List   Diagnosis Date Noted  . Iron deficiency anemia 01/06/2019  . DUB (dysfunctional uterine bleeding) 01/06/2019  . Pelvic pain 01/06/2019  . Postpartum care following cesarean delivery 12/26/2018  . Postpartum anemia 12/20/2018  . Vaginal Pap smear, abnormal   . S/P cesarean section for AOD 12/14/2018  . Tobacco use complicating pregnancy 56/43/3295  . S/P LEEP 06/05/2018    Past Surgical History:  Procedure Laterality Date  . CESAREAN SECTION N/A 12/14/2018   Procedure: CESAREAN SECTION;  Surgeon: Osborne Oman, MD;  Location: [facility] LD ORS;  Service: Obstetrics;  Laterality: N/A;  . LEEP    . WISDOM TOOTH EXTRACTION       OB History    Gravida  1   Para  1   Term  1   Preterm      AB      Living  1     SAB      TAB      Ectopic      Multiple  0   Live Births  1            Home Medications    Prior to Admission medications    Medication Sig Start Date End Date Taking? Authorizing Provider  albuterol (VENTOLIN HFA) 108 (90 Base) MCG/ACT inhaler Inhale 2 puffs into the lungs every 4 (four) hours as needed for wheezing or shortness of breath. 12/16/18   Jorje Guild, NP  amLODipine (NORVASC) 10 MG tablet Take 1 tablet (10 mg total) by mouth daily. 12/20/18   Anyanwu, Sallyanne Havers, MD  escitalopram (LEXAPRO) 5 MG tablet Take 1 tablet (5 mg total) by mouth at bedtime. 12/20/18   Anyanwu, Sallyanne Havers, MD  ferrous sulfate 325 (65 FE) MG tablet Take 1 tablet (325 mg total) by mouth 2 (two) times daily with a meal. 12/16/18   Jorje Guild, NP  ibuprofen (ADVIL) 800 MG tablet Take 1 tablet (800 mg total) by mouth 3 (three) times daily with meals as needed for headache or moderate pain. 12/26/18   Chancy Milroy, MD  ondansetron (ZOFRAN) 4 MG tablet Take 4 mg by mouth every 8 (eight) hours as needed for nausea or vomiting.    [provider]  oxyCODONE (OXY IR/ROXICODONE) 5 MG immediate release tablet Take 1 tablet (5 mg total) by mouth every 4 (four) hours as needed for  moderate pain. 12/20/18   Anyanwu, Jethro BastosUgonna A, MD  Prenatal Vit-Fe Fumarate-FA (PRENATAL MULTIVITAMIN) TABS tablet Take 1 tablet by mouth daily at 12 noon.    [provider]  senna-docusate (SENOKOT-S) 8.6-50 MG tablet Take 2 tablets by mouth at bedtime as needed for mild constipation or moderate constipation. 12/20/18   Tereso NewcomerAnyanwu, Ugonna A, MD  zinc oxide (BALMEX) 11.3 % CREA cream Apply to affected area daily 12/20/18   Anyanwu, Jethro BastosUgonna A, MD    Family History Family History  Problem Relation Age of Onset  . Hodgkin's lymphoma Mother   . Cancer Father   . Hypertension Maternal Grandmother   . Cancer Paternal Grandfather        oral    Social History Social History   Tobacco Use  . Smoking status: Current Every Day Smoker    Packs/day: 0.25    Years: 6.00    Pack years: 1.50    Types: Cigarettes  . Smokeless tobacco: Never Used  Substance Use  Topics  . Alcohol use: No  . Drug use: No     Allergies   Latex   Review of Systems Review of Systems  Constitutional: Negative for chills and fever.  HENT: Negative for congestion and sore throat.   Eyes: Negative.   Respiratory: Negative for chest tightness and shortness of breath.   Cardiovascular: Negative for chest pain.  Gastrointestinal: Positive for abdominal pain. Negative for nausea and vomiting.  Genitourinary: Positive for vaginal bleeding. Negative for dysuria.  Musculoskeletal: Negative for arthralgias, joint swelling and neck pain.  Skin: Negative.  Negative for rash and wound.  Neurological: Negative for dizziness, weakness, light-headedness, numbness and headaches.  Psychiatric/Behavioral: Negative.      Physical Exam Updated Vital Signs BP 115/83   Pulse 88   Temp 98.5 F (36.9 C)   Resp 20   Ht 5\' 1"  (1.549 m)   SpO2 100%   BMI 32.99 kg/m   Physical Exam Vitals signs and nursing note reviewed.  Constitutional:      Appearance: She is well-developed.  HENT:     Head: Normocephalic and atraumatic.  Eyes:     Conjunctiva/sclera: Conjunctivae normal.  Neck:     Musculoskeletal: Normal range of motion.  Cardiovascular:     Rate and Rhythm: Normal rate and regular rhythm.     Heart sounds: Normal heart sounds.  Pulmonary:     Effort: Pulmonary effort is normal.     Breath sounds: Normal breath sounds. No wheezing.  Abdominal:     General: Abdomen is protuberant. Bowel sounds are normal. There is no distension.     Palpations: Abdomen is soft.     Tenderness: There is abdominal tenderness in the suprapubic area.     Comments: Well healed transverse suprapubic incision.  There are 3 localized areas along the length of the incision line, 2 tiny, bb like mobile nodules, the 3rd approx 1 cm, soft, no erythema. Pain is mild and localizes to the c section scar.  No guarding, no erythema.   Musculoskeletal: Normal range of motion.  Skin:    General:  Skin is warm and dry.  Neurological:     Mental Status: She is alert.      ED Treatments / Results  Labs (all labs ordered are listed, but only abnormal results are displayed) Labs Reviewed  CBC WITH DIFFERENTIAL/PLATELET - Abnormal; Notable for the following components:      Result Value   RBC 3.37 (*)    Hemoglobin  10.4 (*)    HCT 33.6 (*)    All other components within normal limits  COMPREHENSIVE METABOLIC PANEL - Abnormal; Notable for the following components:   Potassium 3.4 (*)    Calcium 8.8 (*)    All other components within normal limits  LIPASE, BLOOD  SAMPLE TO BLOOD BANK    EKG None  Radiology No results found.  Procedures Ultrasound ED Soft Tissue  Date/Time: 01/06/2019 4:30 PM Performed by: Burgess Amor, PA-C Authorized by: Burgess Amor, PA-C   Procedure details:    Indications: localization of abscess and evaluate for cellulitis     Transverse view:  Visualized   Longitudinal view:  Visualized   Images: not archived   Location:    Location: abdominal wall   Findings:     no abscess present    no cellulitis present    foreign body present Comments:     Subcutaneous sutures noted.  No fluid pockets to suggest abscess.     (including critical care time)    Medications Ordered in ED Medications - No data to display   Initial Impression / Assessment and Plan / ED Course  I have reviewed the triage vital signs and the nursing notes.  Pertinent labs & imaging results that were available during my care of the patient were reviewed by me and considered in my medical decision making (see chart for details).        Pt with post partum bleeding and pain along c section incision, localized at site of small nodules which are suspected inflammatory changes around subcutaneous sutures.  Bedside US performed - no fluid pockets appreciated, but there were prominent sutures noted at sites of nodules.  Mildly tender.  Abdomen without acute findings, no  guarding.  She was in dept 3 hours without need of changing her pad.  Her labs were reviewed and discussed with her including a hgb today of 10.4 with the last hgb  Of 7.3 on 9/30.  She has post partum visit with [facility] in 4 days. Encouraged to f/u as planned.  She was given strict return precautions - worse pain, fevers, weakness or dizziness. Pt was comfortable at time of dc.   Final Clinical Impressions(s) / ED Diagnoses   Final diagnoses:  DUB (dysfunctional uterine bleeding)  Pain at surgical incision    ED Discharge Orders    None       Victoriano Lain 01/07/19 [id number]    Eber Hong, MD 01/08/19 [phone]

## 2019-01-07 ENCOUNTER — Ambulatory Visit: Payer: Medicaid Other | Admitting: Physician Assistant

## 2019-01-09 ENCOUNTER — Telehealth: Payer: Medicaid Other | Admitting: Advanced Practice Midwife

## 2019-01-27 ENCOUNTER — Telehealth: Payer: Medicaid Other | Admitting: Women's Health

## 2019-01-31 ENCOUNTER — Telehealth: Payer: Self-pay | Admitting: Physician Assistant

## 2019-02-03 ENCOUNTER — Other Ambulatory Visit: Payer: Self-pay

## 2019-02-03 ENCOUNTER — Ambulatory Visit: Payer: Medicaid Other | Admitting: Physician Assistant

## 2019-02-03 ENCOUNTER — Encounter: Payer: Self-pay | Admitting: Physician Assistant

## 2019-02-03 VITALS — BP 114/72 | HR 69 | Temp 96.0°F | Ht 61.0 in | Wt 174.0 lb

## 2019-02-03 DIAGNOSIS — F321 Major depressive disorder, single episode, moderate: Secondary | ICD-10-CM | POA: Diagnosis not present

## 2019-02-03 DIAGNOSIS — F411 Generalized anxiety disorder: Secondary | ICD-10-CM | POA: Diagnosis not present

## 2019-02-03 MED ORDER — SERTRALINE HCL 100 MG PO TABS: 50.0000 mg | ORAL_TABLET | Freq: Every day | ORAL | 5 refills | Status: DC

## 2019-02-03 MED ORDER — BUSPIRONE HCL 10 MG PO TABS: 10.0000 mg | ORAL_TABLET | Freq: Three times a day (TID) | ORAL | 2 refills | Status: DC
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
## 2019-02-03 NOTE — Patient Instructions (Addendum)
[facility] [phone]   Your provider wants you to schedule an appointment with a Psychologist/Psychiatrist. The following list of offices requires the patient to call and make their own appointment, as there is information they need that only you can provide. Please feel free to choose form the following providers:  [facility]   [phone] Crisis Recovery in [place] [phone]  [facility]  [phone]   [address] [place], [place]  (Scheduled through [facility]) Must call and do an interview for appointment. Sees Children / Accepts Medicaid  [facility]    [phone]  [address], [address]    [place], [place]       [facility]  [phone] [address] [place], [place]  Evaluates for Autism but does not treat it Sees Children / Accepts Medicaid  [facility]    [phone] [address], [address]   [place], [place] Medication management, substance abuse, bipolar, grief, family, marriage, OCD, anxiety, PTSD Sees children / Accepts Medicaid  [facility]    [phone] [address] [place], [place] Sees children / Accepts Medicaid  [facility]  [phone] [address] [place], [place]   Dr Akinlayo     [phone] [address] [place], [place]  Sees ADD & ADHD for treatment Accepts Medicaid  [facility]  [phone] [address] [place] Evaluates for Autism Accepts Medicaid  [facility]  [phone] [address] [place], [place]  Does Adult ADD evaluations Does not accept Medicaid  [facility]   [phone] [address]   [place], [place] Uses animal therapy  Sees children as young as 3 years old Accepts Medicaid  [facility]     [phone]    [address]  [place],  [address] Sees children Accepts Medicaid   

## 2019-02-07 ENCOUNTER — Ambulatory Visit: Payer: Medicaid Other | Admitting: Physician Assistant

## 2019-02-24 ENCOUNTER — Telehealth: Payer: Self-pay | Admitting: Physician Assistant

## 2019-02-25 ENCOUNTER — Telehealth: Payer: Self-pay | Admitting: Physician Assistant

## 2019-02-25 NOTE — Telephone Encounter (Signed)
Duplicate call, see previous.

## 2019-02-25 NOTE — Telephone Encounter (Signed)
Duplicate call, see call from 12/7-will close encounter.

## 2019-02-28 NOTE — Telephone Encounter (Signed)
Patient has appointment on Monday and will discuss at appt.

## 2019-03-03 ENCOUNTER — Ambulatory Visit: Payer: Medicaid Other | Admitting: Physician Assistant

## 2019-03-03 ENCOUNTER — Other Ambulatory Visit: Payer: Self-pay

## 2019-03-04 ENCOUNTER — Ambulatory Visit (INDEPENDENT_AMBULATORY_CARE_PROVIDER_SITE_OTHER): Payer: Medicaid Other | Admitting: Physician Assistant

## 2019-03-04 ENCOUNTER — Telehealth: Payer: Self-pay | Admitting: *Deleted

## 2019-03-04 ENCOUNTER — Encounter: Payer: Self-pay | Admitting: Physician Assistant

## 2019-03-04 VITALS — BP 134/79 | HR 66 | Temp 97.8°F | Ht 61.0 in | Wt 174.0 lb

## 2019-03-04 DIAGNOSIS — G43809 Other migraine, not intractable, without status migrainosus: Secondary | ICD-10-CM | POA: Diagnosis not present

## 2019-03-04 DIAGNOSIS — F411 Generalized anxiety disorder: Secondary | ICD-10-CM

## 2019-03-04 DIAGNOSIS — F39 Unspecified mood [affective] disorder: Secondary | ICD-10-CM | POA: Diagnosis not present

## 2019-03-04 MED ORDER — SUMATRIPTAN SUCCINATE 50 MG PO TABS: 50.0000 mg | ORAL_TABLET | ORAL | 5 refills | Status: DC | PRN

## 2019-03-04 MED ORDER — BUSPIRONE HCL 15 MG PO TABS: 15.0000 mg | ORAL_TABLET | Freq: Three times a day (TID) | ORAL | 2 refills | Status: DC

## 2019-03-04 MED ORDER — ARIPIPRAZOLE 5 MG PO TABS: 5.0000 mg | ORAL_TABLET | Freq: Every day | ORAL | 5 refills | Status: DC

## 2019-03-04 NOTE — Telephone Encounter (Addendum)
Prior Auth for Abilify 5mg -APPROVED till 03/03/20  FG#[id number]  [employer] portal  Pharmacy notified
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
## 2019-03-04 NOTE — Patient Instructions (Signed)
Managing Bipolar Disorder °When someone is diagnosed with bipolar disorder, the person may be relieved to now know why he or she has felt or behaved a certain way. The person may also feel overwhelmed about the treatment ahead, how to get needed support, and how to deal with the condition each day. With care and support, a person with bipolar disorder can learn to manage his or her symptoms and live with the condition. °How to manage lifestyle changes °Managing stress °Stress is your body's reaction to life changes and events, both good and bad. Stress can play a major role in bipolar disorder, so it is important to learn how to manage stress. Some techniques to help you manage stress include: °· Meditation, muscle relaxation, and breathing exercises. °· Exercise. Even a short daily walk can help to lower stress levels. °· Getting enough good-quality sleep. Too little sleep can cause mania to start (can trigger mania). °· Making a schedule to manage your time. Knowing your daily schedule can help to keep you from feeling overwhelmed by tasks and deadlines. °· Spending time on hobbies you enjoy. ° °Medicines °Your health care provider may suggest certain medicines if he or she feels that they will help improve your condition. Avoid using caffeine, alcohol, and other substances that may prevent your medicines from working properly. It is also important to: °· Talk with your pharmacist or health care provider about all the medicines that you take, their possible side effects, and which medicines are safe to take together. °· Make it your goal to take part in all treatment decisions (shared decision-making). Ask about possible side effects of medicines that your health care provider recommends, and tell him or her how you feel about having those side effects. It is best if shared decision-making with your health care provider is part of your total treatment plan. °If you are taking medicines as part of your treatment,  do not stop taking medicines before you ask your health care provider if it is safe to stop. You may need to have the medicine slowly decreased (tapered) over time to lower the risk of harmful side effects. °Relationships °Spend time with people whom you trust and with whom you feel a sense of understanding and calm. Try to find friends or family members who make you feel safe and can help you control feelings of mania. Consider going to couples counseling, family education classes, or family therapy to: °· Educate your loved ones about your condition and offer suggestions about how they can support you. °· Help resolve conflicts. °· Help develop communication skills in your relationships. ° °How to recognize changes in your condition °Everyone responds differently to treatment for bipolar disorder. Some signs that your condition is improving include: °· Leveling of your mood. You may have less anger and excitement about daily activities, and your low moods may not be as bad. °· Your symptoms being less intense. °· Feeling calm more often. °· Thinking clearly. °· Not experiencing consequences for extreme behavior. °· Feeling like your life is settling down. °· Your behavior seeming more normal to you and to other people. °Some signs that your condition may be getting worse include: °· Sleep problems. °· Moods cycling between deep lows and unusually high (excess) energy. °· Extreme emotions. °· More anger at loved ones. °· Staying away from others, or isolating yourself. °· A feeling of power or superiority. °· Completing a lot of tasks in a very short amount of time. °· Unusual thoughts   and behaviors. °· Suicidal thoughts. °Follow these instructions at home: °Medicines °· Take over-the-counter and prescription medicines only as told by your health care provider or pharmacist. °· Ask your pharmacist what over-the-counter cold medicines you should avoid. Some medicines can make symptoms worse. °General  instructions °· Ask for support from trusted family members or friends to make sure you stay on track with your treatment. °· Keep a journal to write down your daily moods, medicines, sleep habits, and life events. This may help you have more success with your treatment. °· Make and follow a routine for daily meal times. Eat healthy foods, such as whole grains, vegetables, and fresh fruit. °· Try to go to sleep and wake up around the same time every day. °· Keep all follow-up visits as told by your health care provider. This is important. °Where to find support °Talking to others °· Try making a list of the people you may want to tell about your condition, such as the people you trust most. °· Plan what you are willing and not willing to talk about. Think about your needs ahead of time, and how your friends and family members can support you. °· Let your loved ones know when they can share advice and when you would just like them to listen. °· Give your loved ones information about bipolar disorder, and encourage them to learn about the condition. °Finances °Not all insurance plans cover mental health care, so it is important to check with your insurance carrier. If paying for co-pays or counseling services is a problem, search for a local or county mental health care center. Public mental health care services may be offered there at a low cost or no cost when you are not able to see a private health care provider. °If you are taking medicine for depression, you may be able to get the generic form, which may be less expensive than brand-name medicine. Some makers of prescription medicines also offer help to patients who cannot afford the medicines they need. °Questions to ask your health care provider: °· If you are taking medicines: °? How long do I need to take medicine? °? Are there any long-term side effects of my medicine? °? Are there any alternatives to taking medicine? °· How would I benefit from  therapy? °· How often should I follow up with a health care provider? °Contact a health care provider if: °· Your symptoms get worse or they do not get better with treatment. °Get help right away if: °· You have thoughts about harming yourself or others. °If you ever feel like you may hurt yourself or others, or have thoughts about taking your own life, get help right away. You can go to your nearest emergency department or call: °· Your local emergency services (911 in the [place] °· A suicide crisis helpline, such as the National Suicide Prevention Lifeline at 1-800-273-8255. This is open 24-hours a day. °Summary °· Learning ways to manage stress can help to calm you and may also help your treatment work better. °· There is a wide range of medicines that can help to treat bipolar disorder. °· Having healthy relationships can help to make your moods more stable. °· Contact a health care provider if your symptoms get worse or they do not get better with treatment. °This information is not intended to replace advice given to you by your health care provider. Make sure you discuss any questions you have with your health care provider. °Document Released:   07/06/2016 Document Revised: 06/28/2018 Document Reviewed: 07/06/2016 °Elsevier Patient Education © 2020 [employer] ° °

## 2019-03-05 ENCOUNTER — Encounter: Payer: Medicaid Other | Admitting: Advanced Practice Midwife

## 2019-03-24 ENCOUNTER — Telehealth: Payer: Self-pay | Admitting: Physician Assistant
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
## 2019-03-24 NOTE — Telephone Encounter (Signed)
Appt scheduled for 01/06 @ 10:40.

## 2019-03-26 ENCOUNTER — Ambulatory Visit (INDEPENDENT_AMBULATORY_CARE_PROVIDER_SITE_OTHER): Payer: Medicaid Other | Admitting: Physician Assistant

## 2019-03-26 ENCOUNTER — Other Ambulatory Visit: Payer: Self-pay

## 2019-03-26 ENCOUNTER — Encounter: Payer: Self-pay | Admitting: Physician Assistant

## 2019-03-26 DIAGNOSIS — L309 Dermatitis, unspecified: Secondary | ICD-10-CM

## 2019-03-26 DIAGNOSIS — M9901 Segmental and somatic dysfunction of cervical region: Secondary | ICD-10-CM | POA: Diagnosis not present

## 2019-03-26 DIAGNOSIS — S199XXD Unspecified injury of neck, subsequent encounter: Secondary | ICD-10-CM

## 2019-03-26 DIAGNOSIS — S0992XS Unspecified injury of nose, sequela: Secondary | ICD-10-CM | POA: Diagnosis not present

## 2019-03-26 MED ORDER — HYDROCORTISONE VALERATE 0.2 % EX OINT: 1.0000 "application " | TOPICAL_OINTMENT | Freq: Two times a day (BID) | CUTANEOUS | 0 refills | Status: DC

## 2019-03-26 MED ORDER — PREDNISONE 10 MG (48) PO TBPK: ORAL_TABLET | ORAL | 0 refills | Status: DC

## 2019-03-26 MED ORDER — CYCLOBENZAPRINE HCL 10 MG PO TABS: 10.0000 mg | ORAL_TABLET | Freq: Three times a day (TID) | ORAL | 0 refills | Status: DC | PRN

## 2019-03-26 MED ORDER — IBUPROFEN 800 MG PO TABS: 800.0000 mg | ORAL_TABLET | Freq: Three times a day (TID) | ORAL | 1 refills | Status: DC | PRN

## 2019-03-26 NOTE — Patient Instructions (Signed)
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
## 2019-03-31 ENCOUNTER — Telehealth: Payer: Self-pay | Admitting: *Deleted

## 2019-03-31 NOTE — Telephone Encounter (Signed)
Hydrocortisone valerate Non Preferred by Medicaid-Pt must try and fail 2 preferred  Preferred are fluticasone cream/ointment and mometasone cream/ointment

## 2019-04-01 ENCOUNTER — Other Ambulatory Visit: Payer: Self-pay | Admitting: Physician Assistant

## 2019-04-01 MED ORDER — MOMETASONE FUROATE 0.1 % EX OINT: TOPICAL_OINTMENT | Freq: Two times a day (BID) | CUTANEOUS | 5 refills | Status: DC

## 2019-04-01 NOTE — Telephone Encounter (Signed)
A prescription of mometasone ointment has been sent to her pharmacy.

## 2019-04-04 ENCOUNTER — Ambulatory Visit: Payer: Medicaid Other | Admitting: Physician Assistant

## 2019-04-09 ENCOUNTER — Encounter: Payer: Self-pay | Admitting: Physician Assistant

## 2019-04-16 DIAGNOSIS — Z1159 Encounter for screening for other viral diseases: Secondary | ICD-10-CM | POA: Diagnosis not present

## 2019-05-19 ENCOUNTER — Other Ambulatory Visit: Payer: Self-pay

## 2019-05-19 ENCOUNTER — Encounter: Payer: Self-pay | Admitting: Physician Assistant

## 2019-05-19 ENCOUNTER — Ambulatory Visit: Payer: Medicaid Other | Admitting: Physician Assistant

## 2019-05-19 DIAGNOSIS — S199XXD Unspecified injury of neck, subsequent encounter: Secondary | ICD-10-CM | POA: Diagnosis not present

## 2019-05-19 DIAGNOSIS — M9901 Segmental and somatic dysfunction of cervical region: Secondary | ICD-10-CM | POA: Diagnosis not present

## 2019-05-19 MED ORDER — GABAPENTIN 100 MG PO CAPS: 100.0000 mg | ORAL_CAPSULE | Freq: Every day | ORAL | 3 refills | Status: DC

## 2019-05-19 MED ORDER — TIZANIDINE HCL 6 MG PO CAPS: 6.0000 mg | ORAL_CAPSULE | Freq: Three times a day (TID) | ORAL | 1 refills | Status: DC

## 2019-05-19 MED ORDER — MELOXICAM 15 MG PO TABS: 15.0000 mg | ORAL_TABLET | Freq: Every day | ORAL | 1 refills | Status: DC

## 2019-05-20 ENCOUNTER — Telehealth: Payer: Self-pay | Admitting: *Deleted

## 2019-05-20 ENCOUNTER — Telehealth: Payer: Self-pay | Admitting: Physician Assistant

## 2019-05-20 ENCOUNTER — Other Ambulatory Visit: Payer: Self-pay | Admitting: Physician Assistant

## 2019-05-20 MED ORDER — TIZANIDINE HCL 4 MG PO TABS: 4.0000 mg | ORAL_TABLET | Freq: Three times a day (TID) | ORAL | 0 refills | Status: DC | PRN

## 2019-05-20 NOTE — Telephone Encounter (Signed)
New order for zanaflex tabs is sent to the pharmacy.

## 2019-05-20 NOTE — Telephone Encounter (Signed)
Tizanidine hcl 6mg  Capsules not preferred through medicaid  Patient must try 2 of the following  Baclofen tablet Chlorzoxazone tablet Cyclobenzaprine tablet Methocarbamol tablet Tizanidine tablet

## 2019-05-21 NOTE — Telephone Encounter (Signed)
Detailed message left for patient.
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
## 2019-06-03 ENCOUNTER — Ambulatory Visit (INDEPENDENT_AMBULATORY_CARE_PROVIDER_SITE_OTHER): Payer: Medicaid Other | Admitting: Family Medicine

## 2019-06-03 ENCOUNTER — Encounter: Payer: Self-pay | Admitting: Family Medicine

## 2019-06-03 DIAGNOSIS — Z2089 Contact with and (suspected) exposure to other communicable diseases: Secondary | ICD-10-CM

## 2019-06-03 DIAGNOSIS — Z207 Contact with and (suspected) exposure to pediculosis, acariasis and other infestations: Secondary | ICD-10-CM

## 2019-06-03 DIAGNOSIS — R21 Rash and other nonspecific skin eruption: Secondary | ICD-10-CM

## 2019-06-03 MED ORDER — PERMETHRIN 5 % EX CREA: 1.0000 "application " | TOPICAL_CREAM | Freq: Once | CUTANEOUS | 1 refills | Status: AC

## 2019-06-03 NOTE — Progress Notes (Signed)
Virtual Visit via telephone Note Due to COVID-19 pandemic this visit was conducted virtually. This visit type was conducted due to national recommendations for restrictions regarding the COVID-19 Pandemic (e.g. social distancing, sheltering in place) in an effort to limit this patient's exposure and mitigate transmission in our community. All issues noted in this document were discussed and addressed.  A physical exam was not performed with this format.   I connected with Lori Barnes on 06/03/2019 at 1655 by telephone and verified that I am speaking with the correct person using two identifiers. Lori Barnes is currently located at home and family is currently with them during visit. The provider, Kari Baars, FNP is located in their office at time of visit.  I discussed the limitations, risks, security and privacy concerns of performing an evaluation and management service by telephone and the availability of in person appointments. I also discussed with the patient that there may be a patient responsible charge related to this service. The patient expressed understanding and agreed to proceed.  Subjective:  Patient ID: Lori Barnes, female    DOB: July 07, 1990, 29 y.o.   MRN: [id number]  Chief Complaint:  Rash   HPI: Lori Barnes is a 29 y.o. female presenting on 06/03/2019 for Rash   Pt reports a pruritic rash that has erupted on her hands, arms, and trunk. States the rash is small clustered raised bumps that are very pruritic.   Rash This is a new problem. The current episode started in the past 7 days. The problem has been gradually worsening since onset. The affected locations include the left wrist, left hand, right wrist, right hand, right fingers, left fingers, left arm, right arm and abdomen. The rash is characterized by redness and itchiness. Associated with: scabies. Pertinent negatives include no cough, diarrhea, fatigue, fever, shortness of breath, sore throat or  vomiting.     Relevant past medical, surgical, family, and social history reviewed and updated as indicated.  Allergies and medications reviewed and updated.   Past Medical History:  Diagnosis Date  . Depression   . Gestational hypertension 12/11/2018  . Vaginal Pap smear, abnormal     Past Surgical History:  Procedure Laterality Date  . CESAREAN SECTION N/A 12/14/2018   Procedure: CESAREAN SECTION;  Surgeon: Tereso Newcomer, MD;  Location: [facility] LD ORS;  Service: Obstetrics;  Laterality: N/A;  . LEEP    . WISDOM TOOTH EXTRACTION      Social History   Socioeconomic History  . Marital status: Single    Spouse name: Not on file  . Number of children: Not on file  . Years of education: Not on file  . Highest education level: Not on file  Occupational History  . Not on file  Tobacco Use  . Smoking status: Current Every Day Smoker    Packs/day: 0.25    Years: 6.00    Pack years: 1.50    Types: Cigarettes  . Smokeless tobacco: Never Used  Substance and Sexual Activity  . Alcohol use: No  . Drug use: No  . Sexual activity: Not Currently    Birth control/protection: None  Other Topics Concern  . Not on file  Social History Narrative  . Not on file   Social Determinants of Health   Financial Resource Strain:   . Difficulty of Paying Living Expenses:   Food Insecurity:   . Worried About Programme researcher, broadcasting/film/video in the Last Year:   . [employer] of  Food in the Last Year:   Transportation Needs:   . Freight forwarder (Medical):   Marland Kitchen Lack of Transportation (Non-Medical):   Physical Activity:   . Days of Exercise per Week:   . Minutes of Exercise per Session:   Stress:   . Feeling of Stress :   Social Connections:   . Frequency of Communication with Friends and Family:   . Frequency of Social Gatherings with Friends and Family:   . Attends Religious Services:   . Active Member of Clubs or Organizations:   . Attends Banker Meetings:   Marland Kitchen Marital Status:    Intimate Partner Violence:   . Fear of Current or Ex-Partner:   . Emotionally Abused:   Marland Kitchen Physically Abused:   . Sexually Abused:     Outpatient Encounter Medications as of 06/03/2019  Medication Sig  . albuterol (VENTOLIN HFA) 108 (90 Base) MCG/ACT inhaler Inhale 2 puffs into the lungs every 4 (four) hours as needed for wheezing or shortness of breath.  Marland Kitchen amLODipine (NORVASC) 10 MG tablet Take 1 tablet (10 mg total) by mouth daily.  . ARIPiprazole (ABILIFY) 5 MG tablet Take 1 tablet (5 mg total) by mouth daily.  . busPIRone (BUSPAR) 15 MG tablet Take 1 tablet (15 mg total) by mouth 3 (three) times daily.  . cyclobenzaprine (FLEXERIL) 10 MG tablet Take 1 tablet (10 mg total) by mouth 3 (three) times daily as needed for muscle spasms.  . ferrous sulfate 325 (65 FE) MG tablet Take 1 tablet (325 mg total) by mouth 2 (two) times daily with a meal.  . gabapentin (NEURONTIN) 100 MG capsule Take 1-3 capsules (100-300 mg total) by mouth at bedtime.  . meloxicam (MOBIC) 15 MG tablet Take 1 tablet (15 mg total) by mouth daily.  . ondansetron (ZOFRAN) 4 MG tablet Take 4 mg by mouth every 8 (eight) hours as needed for nausea or vomiting.  . permethrin (ELIMITE) 5 % cream Apply 1 application topically once for 1 dose.  . sertraline (ZOLOFT) 100 MG tablet Take 0.5-1 tablets (50-100 mg total) by mouth daily. After one week increase to 100 mg  . SUMAtriptan (IMITREX) 50 MG tablet Take 1 tablet (50 mg total) by mouth every 2 (two) hours as needed for migraine. May repeat in 2 hours if headache persists or recurs.  Marland Kitchen tiZANidine (ZANAFLEX) 4 MG tablet Take 1 tablet (4 mg total) by mouth every 8 (eight) hours as needed for muscle spasms.   No facility-administered encounter medications on file as of 06/03/2019.    Allergies  Allergen Reactions  . Latex Other (See Comments)    Blister    Review of Systems  Constitutional: Negative for activity change, appetite change, chills, diaphoresis, fatigue, fever  and unexpected weight change.  HENT: Negative.  Negative for sore throat, trouble swallowing and voice change.   Eyes: Negative.  Negative for photophobia and visual disturbance.  Respiratory: Negative for apnea, cough, choking, chest tightness, shortness of breath, wheezing and stridor.   Cardiovascular: Negative for chest pain, palpitations and leg swelling.  Gastrointestinal: Negative for abdominal pain, blood in stool, constipation, diarrhea, nausea and vomiting.  Endocrine: Negative.  Negative for cold intolerance, heat intolerance, polydipsia, polyphagia and polyuria.  Genitourinary: Negative for decreased urine volume, difficulty urinating, dysuria, frequency and urgency.  Musculoskeletal: Negative for arthralgias and myalgias.  Skin: Positive for color change and rash.  Allergic/Immunologic: Negative.   Neurological: Negative for dizziness, weakness and headaches.  Hematological: Negative.   Psychiatric/Behavioral:  Negative for confusion, hallucinations, sleep disturbance and suicidal ideas.  All other systems reviewed and are negative.        Observations/Objective: No vital signs or physical exam, this was a telephone or virtual health encounter.  Pt alert and oriented, answers all questions appropriately, and able to speak in full sentences.    Assessment and Plan: Lori Barnes was seen today for rash.  Diagnoses and all orders for this visit:  Rash Exposure to scabies Pt with pruritic, clustered rash and known recent exposure to scabies. Will treat with below. Pt aware to wash all clothing and bed clothing in hot water. Pt aware of proper use of below. Report any new, worsening, or persistent symptoms.  -     permethrin (ELIMITE) 5 % cream; Apply 1 application topically once for 1 dose.     Follow Up Instructions: Return if symptoms worsen or fail to improve.    I discussed the assessment and treatment plan with the patient. The patient was provided an opportunity to  ask questions and all were answered. The patient agreed with the plan and demonstrated an understanding of the instructions.   The patient was advised to call back or seek an in-person evaluation if the symptoms worsen or if the condition fails to improve as anticipated.  The above assessment and management plan was discussed with the patient. The patient verbalized understanding of and has agreed to the management plan. Patient is aware to call the clinic if they develop any new symptoms or if symptoms persist or worsen. Patient is aware when to return to the clinic for a follow-up visit. Patient educated on when it is appropriate to go to the emergency department.    I provided 15 minutes of non-face-to-face time during this encounter. The call started at 1655. The call ended at 1710. The other time was used for coordination of care.    Monia Pouch, FNP-C [facility] [address] [place], [place] [address] [phone] 06/03/2019

## 2019-07-01 ENCOUNTER — Ambulatory Visit: Payer: Medicaid Other | Admitting: Physician Assistant

## 2019-08-21 ENCOUNTER — Encounter: Payer: Self-pay | Admitting: Family

## 2019-08-21 ENCOUNTER — Ambulatory Visit (INDEPENDENT_AMBULATORY_CARE_PROVIDER_SITE_OTHER): Payer: Medicaid Other | Admitting: Family

## 2019-08-21 DIAGNOSIS — F411 Generalized anxiety disorder: Secondary | ICD-10-CM | POA: Insufficient documentation

## 2019-08-21 DIAGNOSIS — I10 Essential (primary) hypertension: Secondary | ICD-10-CM | POA: Diagnosis not present

## 2019-08-21 DIAGNOSIS — F321 Major depressive disorder, single episode, moderate: Secondary | ICD-10-CM

## 2019-08-21 DIAGNOSIS — D509 Iron deficiency anemia, unspecified: Secondary | ICD-10-CM | POA: Diagnosis not present

## 2019-08-21 MED ORDER — AMLODIPINE BESYLATE 5 MG PO TABS: 5.0000 mg | ORAL_TABLET | Freq: Every day | ORAL | 3 refills | Status: DC

## 2019-08-21 MED ORDER — ESCITALOPRAM OXALATE 10 MG PO TABS: ORAL_TABLET | ORAL | 0 refills | Status: AC

## 2019-08-21 NOTE — Progress Notes (Signed)
Virtual Visit via telephone Note Due to COVID-19 pandemic this visit was conducted virtually. This visit type was conducted due to national recommendations for restrictions regarding the COVID-19 Pandemic (e.g. social distancing, sheltering in place) in an effort to limit this patient's exposure and mitigate transmission in our community. All issues noted in this document were discussed and addressed.  A physical exam was not performed with this format.  I connected with Lori Barnes on 08/21/19 at 10:31 AM by telephone and verified that I am speaking with the correct person using two identifiers. Lori Barnes is currently located at home and no one is currently with her during visit. The provider, Jannifer Rodney, FNP is located in their office at time of visit.  I discussed the limitations, risks, security and privacy concerns of performing an evaluation and management service by telephone and the availability of in person appointments. I also discussed with the patient that there may be a patient responsible charge related to this service. The patient expressed understanding and agreed to proceed.   History and Present Illness:  Pt calls the office today to establish care. She states she has been out of all of her medications for the last month. She reports she had a baby 8 months ago and is not breastfeeding.   She reports her Grandmother passed on 07/27/2022 and her anxiety and depression is worse. She feels like she needs to restart her medications.  Hypertension This is a chronic problem. The current episode started more than 1 year ago. The problem has been waxing and waning since onset. The problem is uncontrolled. Associated symptoms include anxiety. Pertinent negatives include no peripheral edema or shortness of breath. Past treatments include nothing. The current treatment provides no improvement.  Anxiety Presents for follow-up visit. Symptoms include depressed mood, excessive worry,  insomnia, irritability, nervous/anxious behavior, panic and restlessness. Patient reports no shortness of breath. Symptoms occur occasionally.   Her past medical history is significant for anemia.  Depression        This is a chronic problem.  The current episode started more than 1 year ago.   The onset quality is gradual.   The problem occurs intermittently.  Associated symptoms include insomnia, irritable, restlessness and sad.  Associated symptoms include no helplessness and no hopelessness.  Past treatments include nothing.  Past medical history includes anxiety.   Anemia Presents for follow-up visit. Symptoms include bruises/bleeds easily.      Review of Systems  Constitutional: Positive for irritability.  Respiratory: Negative for shortness of breath.   Endo/Heme/Allergies: Bruises/bleeds easily.  Psychiatric/Behavioral: Positive for depression. The patient is nervous/anxious and has insomnia.   All other systems reviewed and are negative.    Observations/Objective: No SOB or distress noted  Assessment and Plan: Lori Barnes comes in today with chief complaint of No chief complaint on file.   Diagnosis and orders addressed:  1. Iron deficiency anemia, unspecified iron deficiency anemia type  2. Essential hypertension Will restart Norvasc but at 5 mg instead of 10 mg. - amLODipine (NORVASC) 5 MG tablet; Take 1 tablet (5 mg total) by mouth daily.  Dispense: 90 tablet; Refill: 3  3. GAD (generalized anxiety disorder) Will restart Lexapro. Will start at 10 mg and she will increase to 20 mg after 3 weeks.  Stress management  She will call psychologists to make appt for visit  - escitalopram (LEXAPRO) 10 MG tablet; Take 1 tablet (10 mg total) by mouth daily for 21 days, THEN  2 tablets (20 mg total) daily for 21 days.  Dispense: 63 tablet; Refill: 0  4. Depression, major, single episode, moderate (HCC) - escitalopram (LEXAPRO) 10 MG tablet; Take 1 tablet (10 mg total) by  mouth daily for 21 days, THEN 2 tablets (20 mg total) daily for 21 days.  Dispense: 63 tablet; Refill: 0   Labs reivewed Health Maintenance reviewed Diet and exercise encouraged  Follow up plan: 6-8 weeks to recheck      I discussed the assessment and treatment plan with the patient. The patient was provided an opportunity to ask questions and all were answered. The patient agreed with the plan and demonstrated an understanding of the instructions.   The patient was advised to call back or seek an in-person evaluation if the symptoms worsen or if the condition fails to improve as anticipated.  The above assessment and management plan was discussed with the patient. The patient verbalized understanding of and has agreed to the management plan. Patient is aware to call the clinic if symptoms persist or worsen. Patient is aware when to return to the clinic for a follow-up visit. Patient educated on when it is appropriate to go to the emergency department.   Time call ended:  10:47 AM   I provided 16 minutes of non-face-to-face time during this encounter.    Evelina Dun, FNP '

## 2019-10-21 DIAGNOSIS — M542 Cervicalgia: Secondary | ICD-10-CM | POA: Diagnosis not present

## 2019-11-20 DIAGNOSIS — K0889 Other specified disorders of teeth and supporting structures: Secondary | ICD-10-CM | POA: Diagnosis not present

## 2020-02-06 DIAGNOSIS — R0602 Shortness of breath: Secondary | ICD-10-CM | POA: Diagnosis not present

## 2020-02-06 DIAGNOSIS — J029 Acute pharyngitis, unspecified: Secondary | ICD-10-CM | POA: Diagnosis not present

## 2020-02-06 DIAGNOSIS — R059 Cough, unspecified: Secondary | ICD-10-CM | POA: Diagnosis not present

## 2020-02-06 DIAGNOSIS — R519 Headache, unspecified: Secondary | ICD-10-CM | POA: Diagnosis not present

## 2020-02-06 DIAGNOSIS — Z20822 Contact with and (suspected) exposure to covid-19: Secondary | ICD-10-CM | POA: Diagnosis not present

## 2020-02-06 DIAGNOSIS — R21 Rash and other nonspecific skin eruption: Secondary | ICD-10-CM | POA: Diagnosis not present

## 2020-02-06 DIAGNOSIS — R079 Chest pain, unspecified: Secondary | ICD-10-CM | POA: Diagnosis not present

## 2020-02-06 DIAGNOSIS — R509 Fever, unspecified: Secondary | ICD-10-CM | POA: Diagnosis not present

## 2020-02-06 DIAGNOSIS — R6883 Chills (without fever): Secondary | ICD-10-CM | POA: Diagnosis not present

## 2020-03-31 ENCOUNTER — Ambulatory Visit: Payer: Medicaid Other | Admitting: Family Medicine

## 2020-04-16 ENCOUNTER — Encounter: Payer: Self-pay | Admitting: Physician Assistant

## 2020-04-30 ENCOUNTER — Ambulatory Visit: Payer: Medicaid Other | Admitting: Family Medicine

## 2020-05-05 ENCOUNTER — Encounter: Payer: Self-pay | Admitting: Physician Assistant

## 2020-05-08 DIAGNOSIS — M542 Cervicalgia: Secondary | ICD-10-CM | POA: Diagnosis not present

## 2020-05-31 DIAGNOSIS — M7989 Other specified soft tissue disorders: Secondary | ICD-10-CM | POA: Diagnosis not present

## 2020-05-31 DIAGNOSIS — R059 Cough, unspecified: Secondary | ICD-10-CM | POA: Diagnosis not present

## 2020-05-31 DIAGNOSIS — M549 Dorsalgia, unspecified: Secondary | ICD-10-CM | POA: Diagnosis not present

## 2020-05-31 DIAGNOSIS — J4521 Mild intermittent asthma with (acute) exacerbation: Secondary | ICD-10-CM | POA: Diagnosis not present

## 2020-06-02 DIAGNOSIS — R600 Localized edema: Secondary | ICD-10-CM | POA: Diagnosis not present

## 2020-06-07 DIAGNOSIS — R600 Localized edema: Secondary | ICD-10-CM | POA: Diagnosis not present

## 2020-06-07 DIAGNOSIS — Z09 Encounter for follow-up examination after completed treatment for conditions other than malignant neoplasm: Secondary | ICD-10-CM | POA: Diagnosis not present

## 2020-06-10 DIAGNOSIS — Z1329 Encounter for screening for other suspected endocrine disorder: Secondary | ICD-10-CM | POA: Diagnosis not present

## 2020-06-10 DIAGNOSIS — I10 Essential (primary) hypertension: Secondary | ICD-10-CM | POA: Diagnosis not present

## 2020-06-10 DIAGNOSIS — M5412 Radiculopathy, cervical region: Secondary | ICD-10-CM | POA: Diagnosis not present

## 2020-09-22 IMAGING — DX DG CHEST 2V
2 series · 2 of 2 positions shown · non-contrast
Comparison: 09/03/2014

CLINICAL DATA: Infection and cough after c-section

EXAM:
CHEST - 2 VIEW

[chest pa]
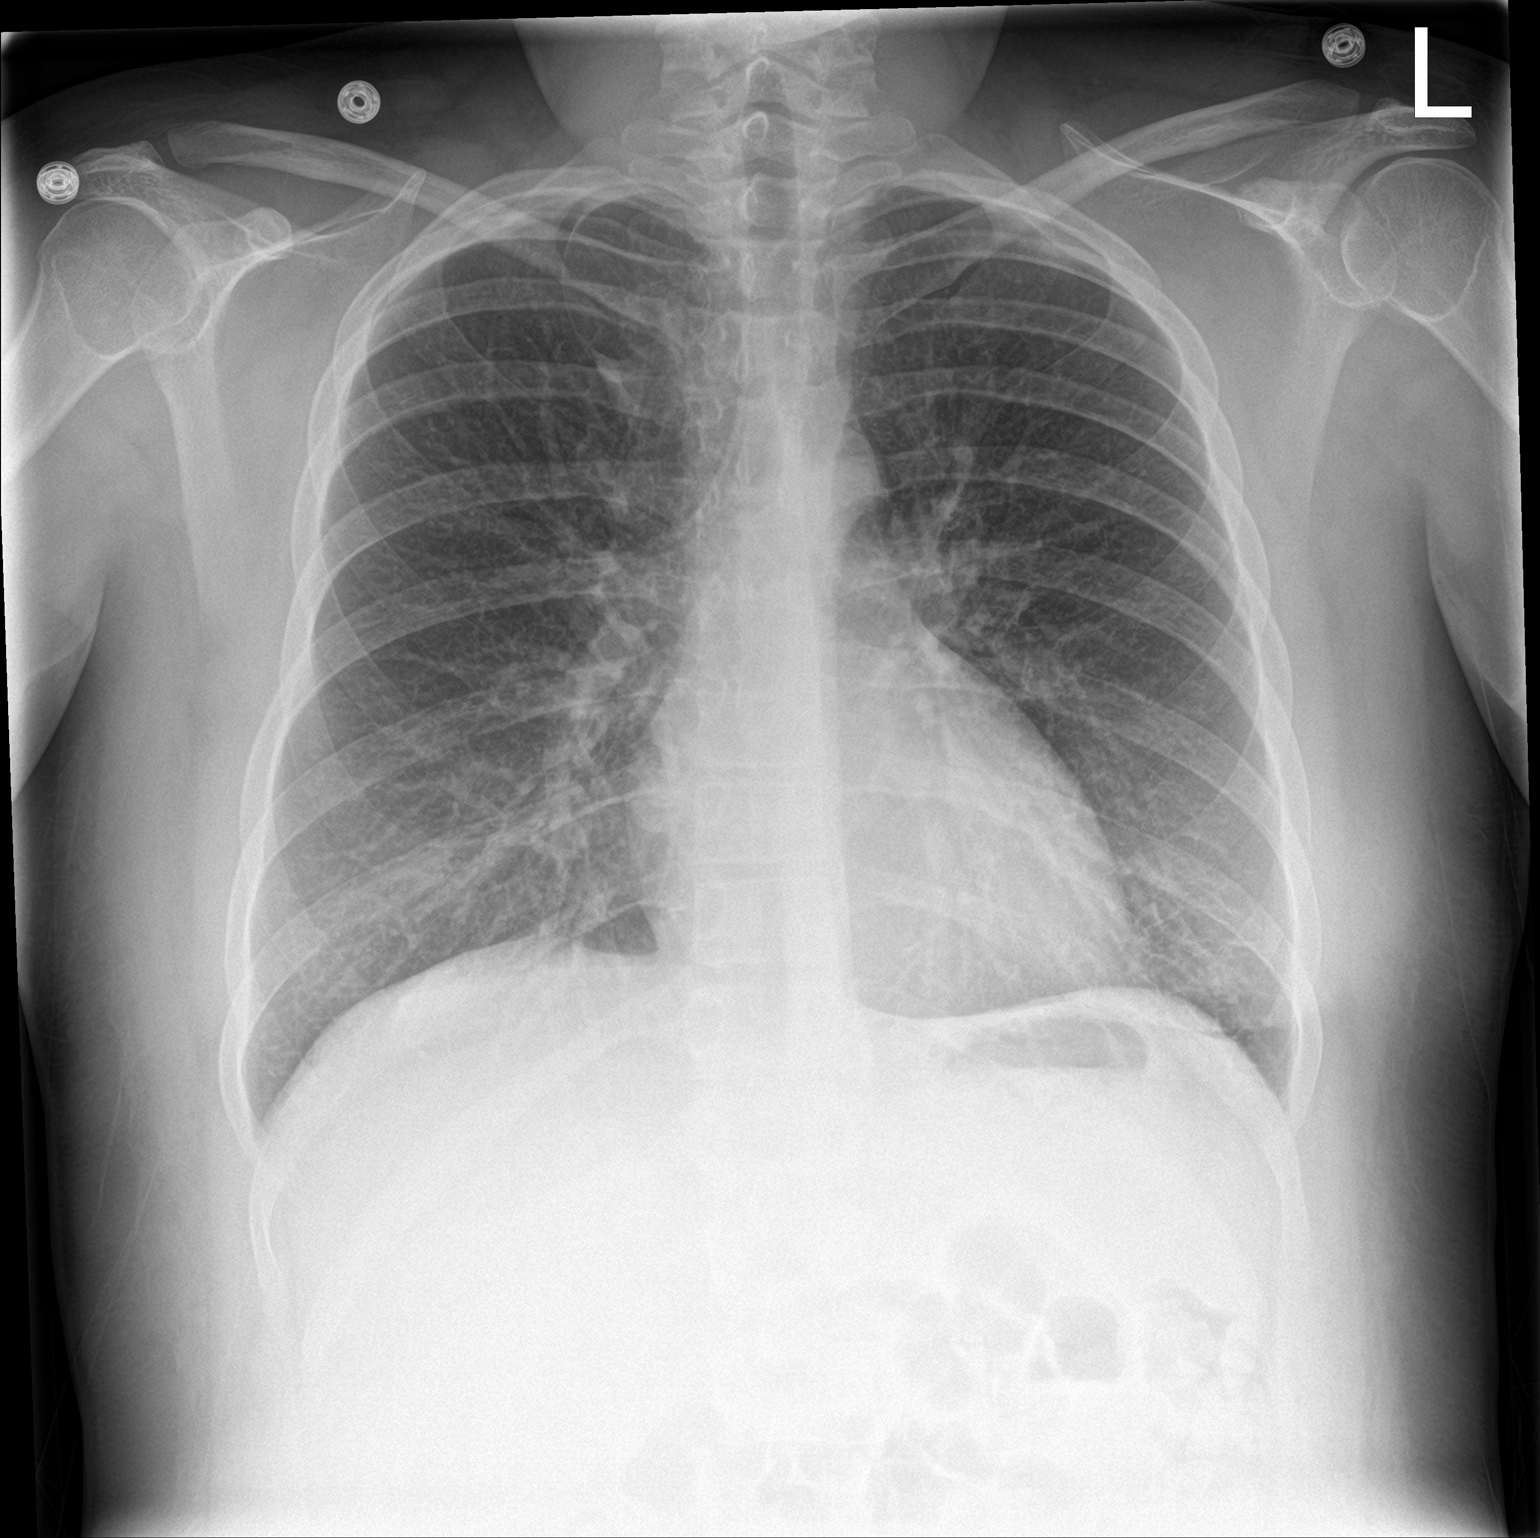

[chest lat]
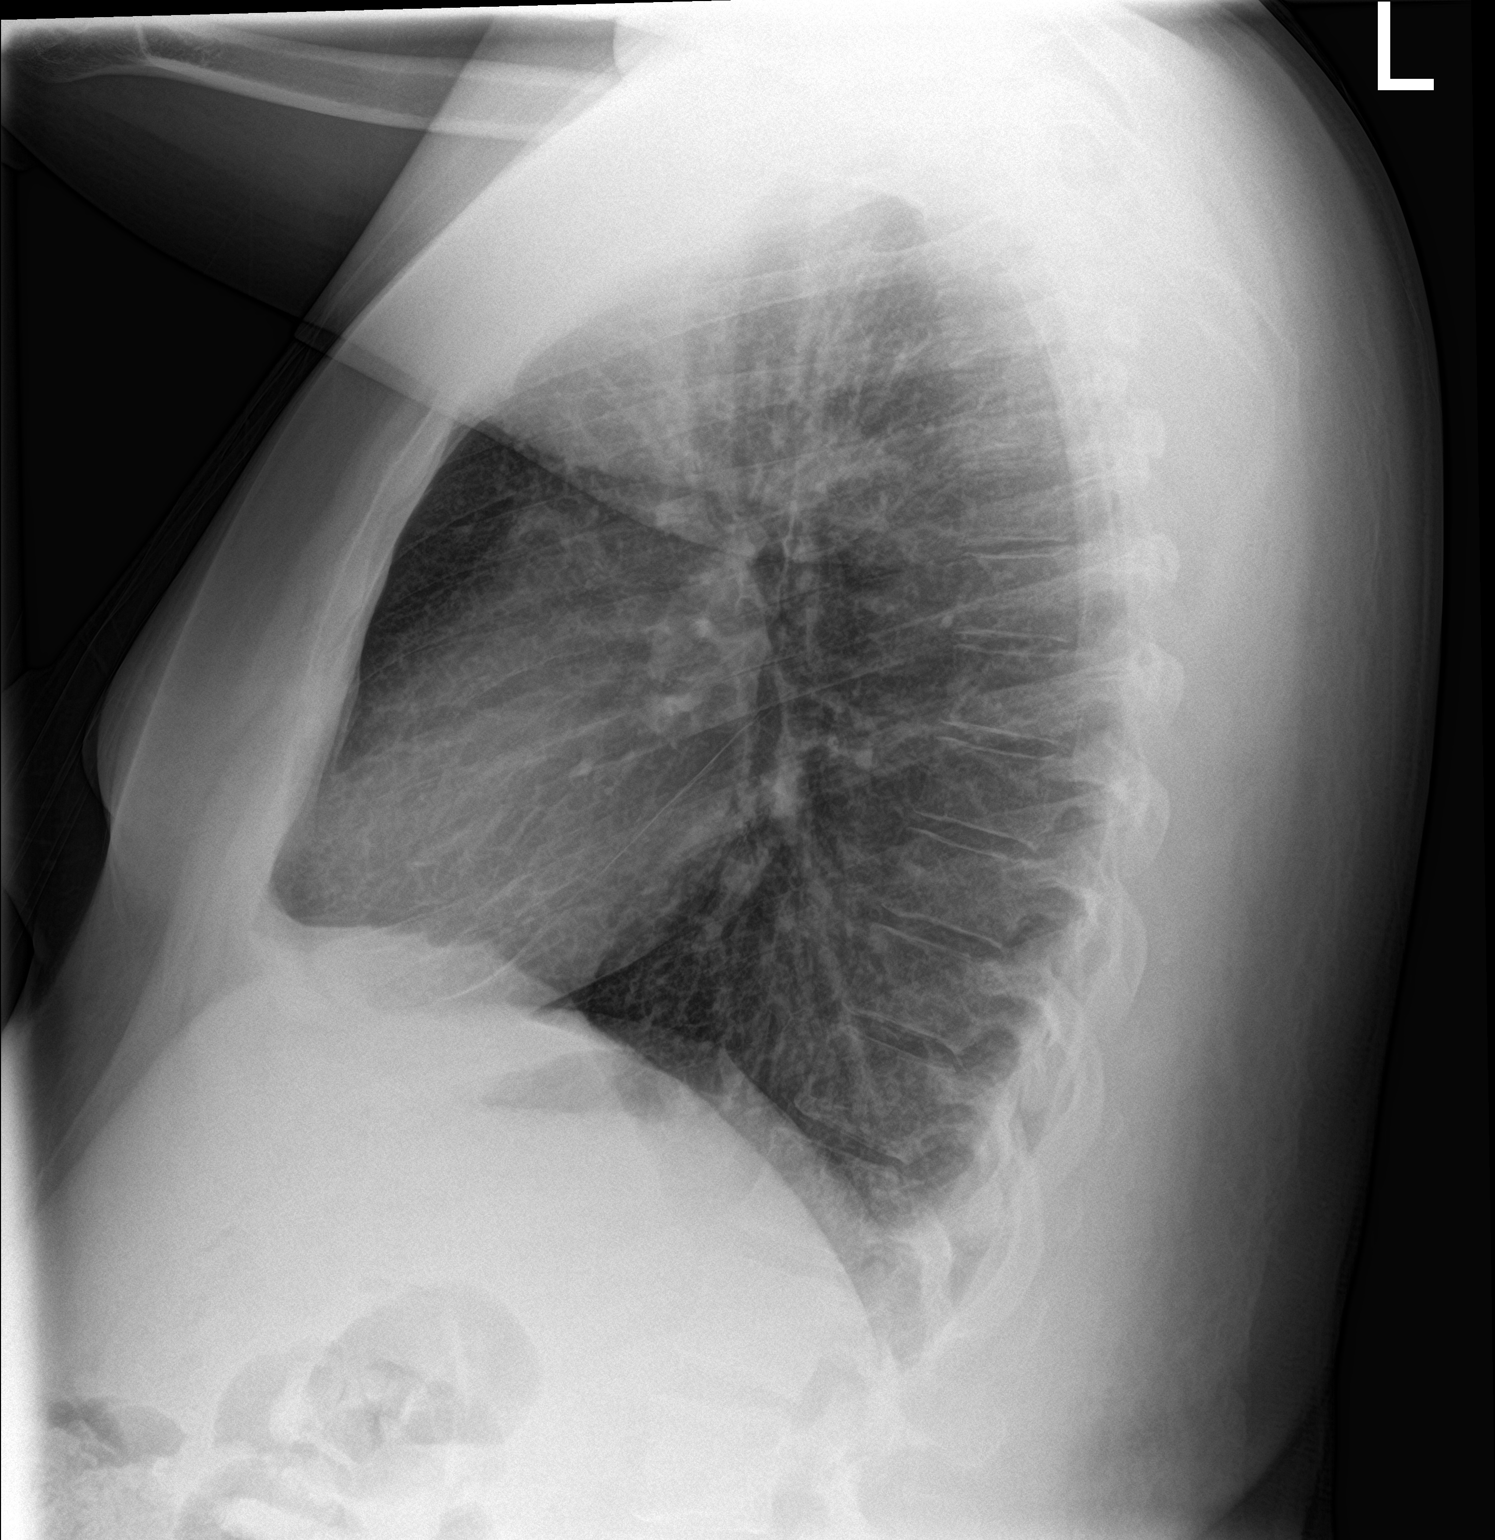

[2 of 2 positions shown; findings below may reference images not displayed]

FINDINGS: Normal mediastinum and cardiac silhouette. Normal pulmonary
vasculature. No evidence of effusion, infiltrate, or pneumothorax.
No acute bony abnormality.
IMPRESSION: Normal chest radiograph

## 2020-09-25 IMAGING — CR DG CHEST 2V
2 series · 2 of 2 positions shown · non-contrast
Comparison: Prior radiograph from 12/15/2018.

CLINICAL DATA: Initial evaluation for worsening hypertension,
recent C-section.

EXAM:
CHEST - 2 VIEW

[chest pa]
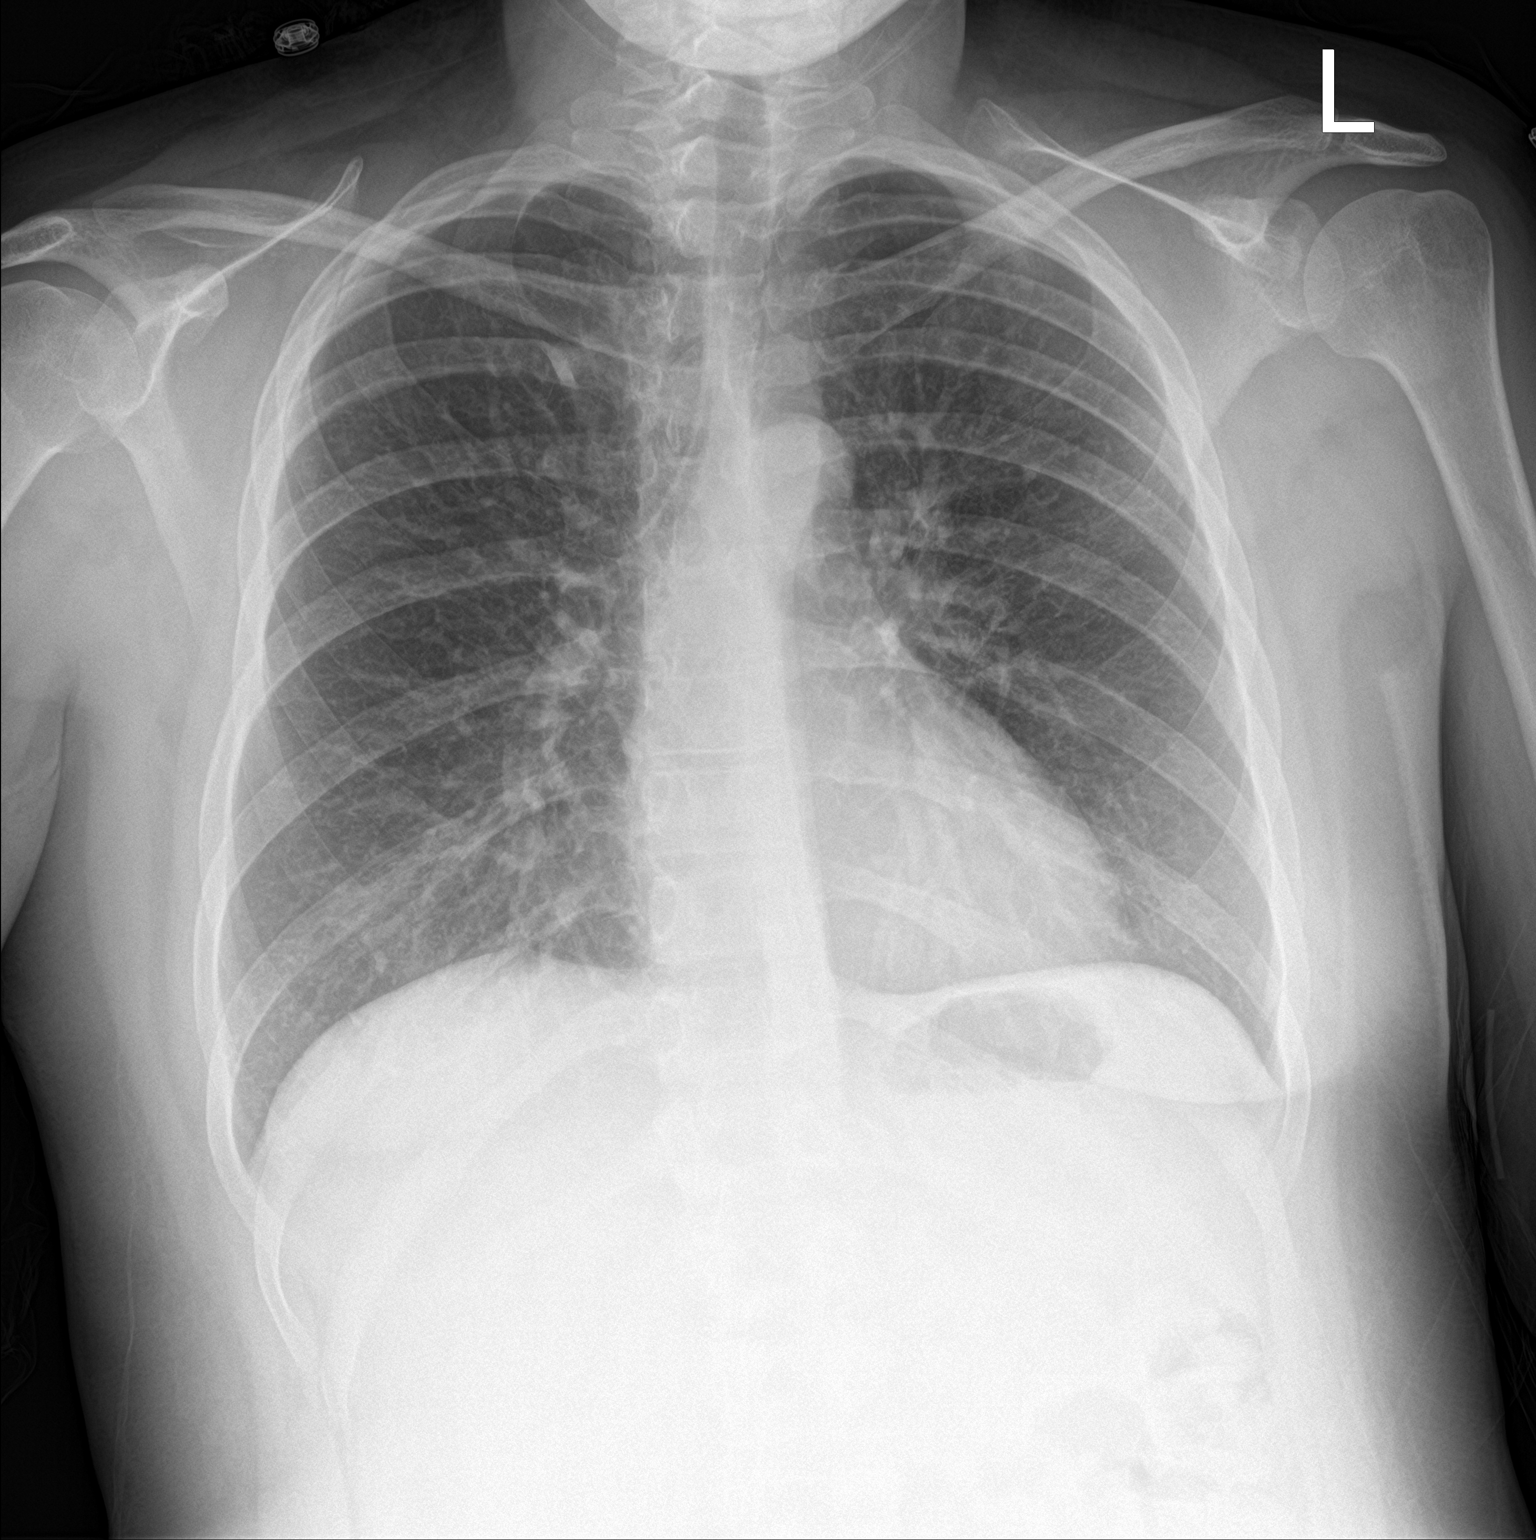

[chest lat]
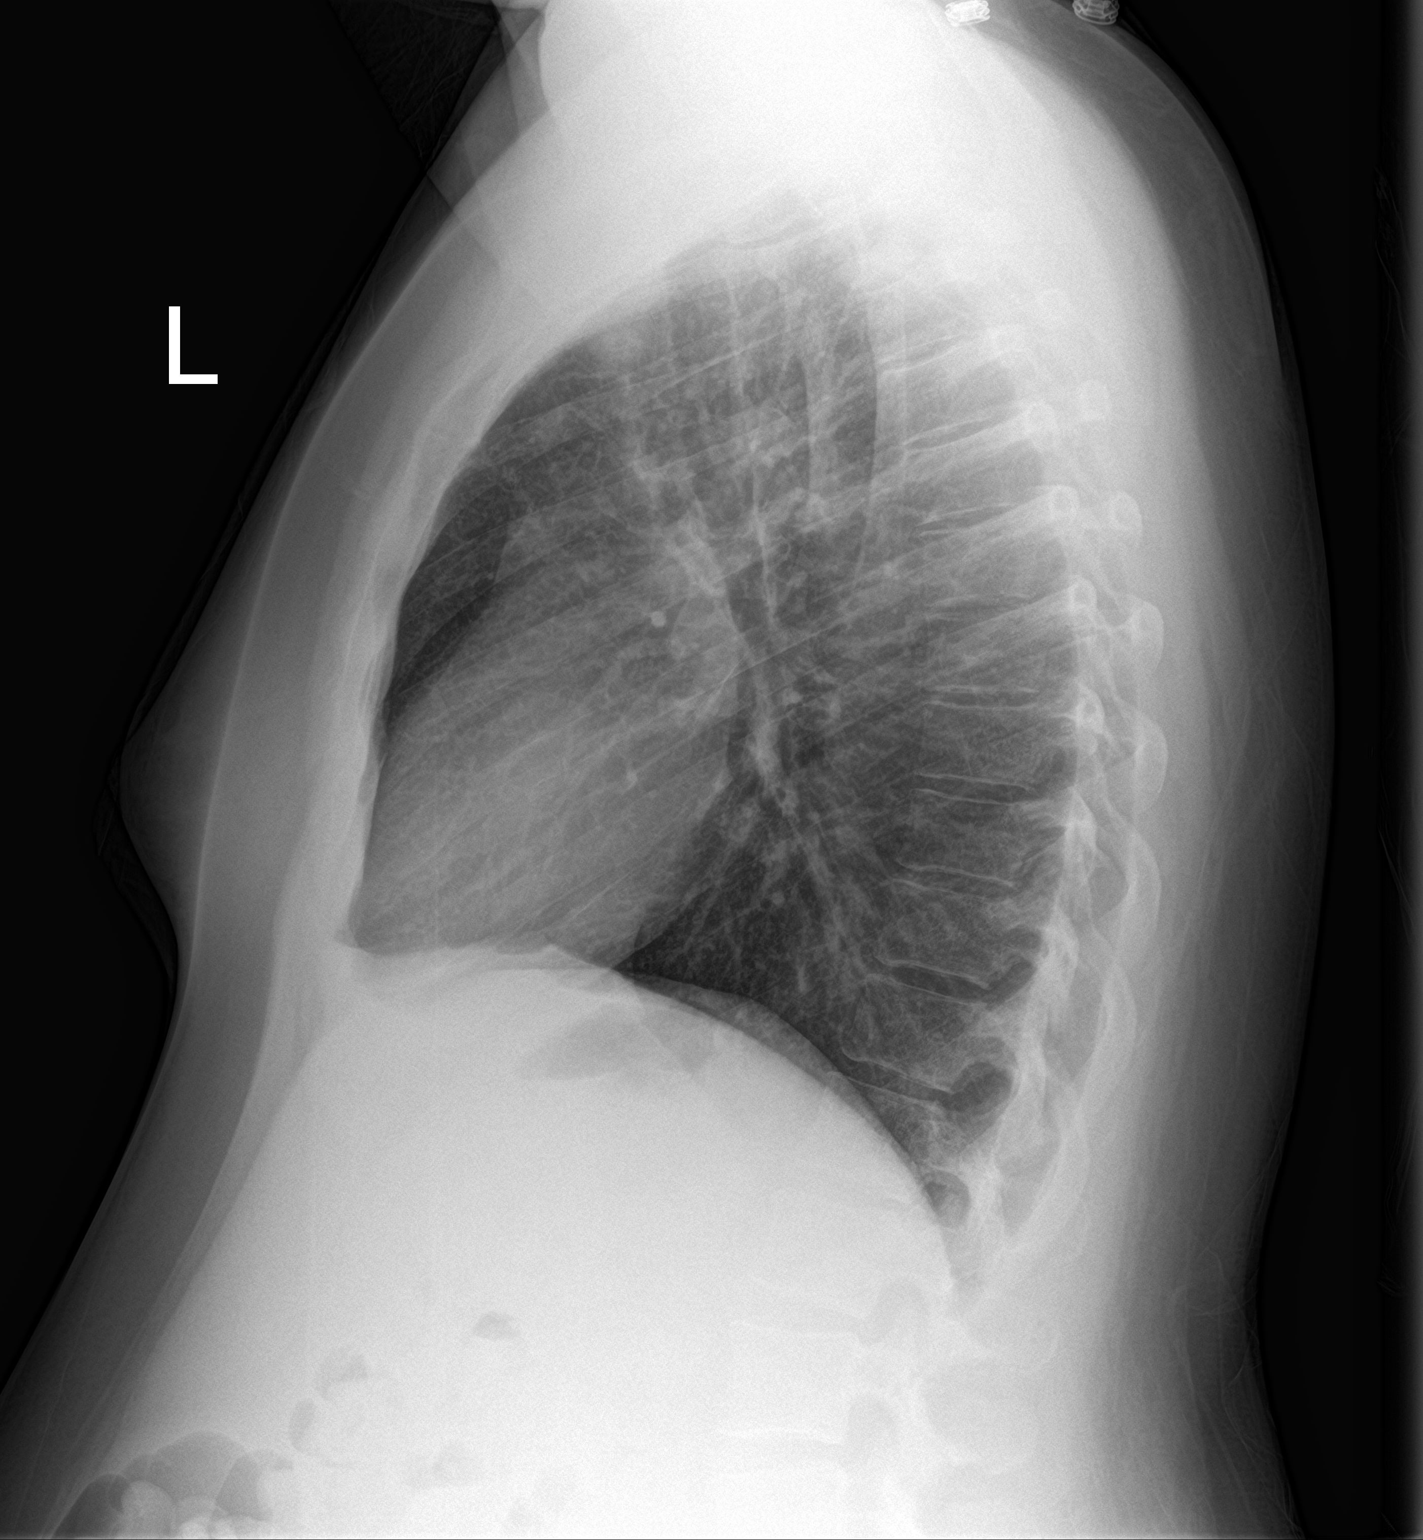

[2 of 2 positions shown; findings below may reference images not displayed]

FINDINGS: The cardiac and mediastinal silhouettes are stable in size and
contour, and remain within normal limits.

The lungs are normally inflated. No airspace consolidation, pleural
effusion, or pulmonary edema. No pneumothorax.

No acute osseous abnormality.
IMPRESSION: Stable and normal appearance of the chest. No active cardiopulmonary
disease identified.

## 2020-11-23 DIAGNOSIS — T63301A Toxic effect of unspecified spider venom, accidental (unintentional), initial encounter: Secondary | ICD-10-CM | POA: Diagnosis not present

## 2020-11-23 DIAGNOSIS — L03116 Cellulitis of left lower limb: Secondary | ICD-10-CM | POA: Diagnosis not present

## 2020-11-23 DIAGNOSIS — R21 Rash and other nonspecific skin eruption: Secondary | ICD-10-CM | POA: Diagnosis not present

## 2020-12-06 DIAGNOSIS — K2100 Gastro-esophageal reflux disease with esophagitis, without bleeding: Secondary | ICD-10-CM | POA: Diagnosis not present

## 2020-12-06 DIAGNOSIS — M542 Cervicalgia: Secondary | ICD-10-CM | POA: Diagnosis not present

## 2020-12-06 DIAGNOSIS — R072 Precordial pain: Secondary | ICD-10-CM | POA: Diagnosis not present

## 2020-12-06 DIAGNOSIS — N911 Secondary amenorrhea: Secondary | ICD-10-CM | POA: Diagnosis not present

## 2020-12-06 DIAGNOSIS — I10 Essential (primary) hypertension: Secondary | ICD-10-CM | POA: Diagnosis not present

## 2021-10-27 DIAGNOSIS — R1900 Intra-abdominal and pelvic swelling, mass and lump, unspecified site: Secondary | ICD-10-CM | POA: Diagnosis not present

## 2021-10-27 DIAGNOSIS — J9801 Acute bronchospasm: Secondary | ICD-10-CM | POA: Diagnosis not present

## 2021-10-27 DIAGNOSIS — M545 Low back pain, unspecified: Secondary | ICD-10-CM | POA: Diagnosis not present

## 2021-10-27 DIAGNOSIS — I10 Essential (primary) hypertension: Secondary | ICD-10-CM | POA: Diagnosis not present

## 2021-10-27 DIAGNOSIS — G9589 Other specified diseases of spinal cord: Secondary | ICD-10-CM | POA: Diagnosis not present

## 2021-10-27 DIAGNOSIS — Z862 Personal history of diseases of the blood and blood-forming organs and certain disorders involving the immune mechanism: Secondary | ICD-10-CM | POA: Diagnosis not present

## 2021-10-27 DIAGNOSIS — J329 Chronic sinusitis, unspecified: Secondary | ICD-10-CM | POA: Diagnosis not present

## 2021-10-27 DIAGNOSIS — M5441 Lumbago with sciatica, right side: Secondary | ICD-10-CM | POA: Diagnosis not present

## 2021-10-27 DIAGNOSIS — R051 Acute cough: Secondary | ICD-10-CM | POA: Diagnosis not present

## 2021-10-27 DIAGNOSIS — R5383 Other fatigue: Secondary | ICD-10-CM | POA: Diagnosis not present

## 2021-10-27 DIAGNOSIS — M542 Cervicalgia: Secondary | ICD-10-CM | POA: Diagnosis not present

## 2021-10-27 DIAGNOSIS — M5431 Sciatica, right side: Secondary | ICD-10-CM | POA: Diagnosis not present

## 2021-10-27 DIAGNOSIS — Z716 Tobacco abuse counseling: Secondary | ICD-10-CM | POA: Diagnosis not present

## 2021-10-27 DIAGNOSIS — F411 Generalized anxiety disorder: Secondary | ICD-10-CM | POA: Diagnosis not present

## 2021-10-27 DIAGNOSIS — M5440 Lumbago with sciatica, unspecified side: Secondary | ICD-10-CM | POA: Diagnosis not present

## 2021-12-22 ENCOUNTER — Telehealth: Payer: Self-pay

## 2021-12-22 NOTE — Telephone Encounter (Signed)
Called pt on 12/22/2021 @08 :02am, 08:39am, 10:51am and 04:38pm. Pt did not answer and has no voicemail setup. Unable to satisfy request.

## 2022-07-01 DIAGNOSIS — M6208 Separation of muscle (nontraumatic), other site: Secondary | ICD-10-CM | POA: Diagnosis not present

## 2022-07-01 DIAGNOSIS — K047 Periapical abscess without sinus: Secondary | ICD-10-CM | POA: Diagnosis not present

## 2022-07-11 DIAGNOSIS — K439 Ventral hernia without obstruction or gangrene: Secondary | ICD-10-CM | POA: Diagnosis not present

## 2022-11-10 DIAGNOSIS — Y929 Unspecified place or not applicable: Secondary | ICD-10-CM | POA: Diagnosis not present

## 2022-11-10 DIAGNOSIS — S2001XA Contusion of right breast, initial encounter: Secondary | ICD-10-CM | POA: Diagnosis not present

## 2022-11-10 DIAGNOSIS — Y999 Unspecified external cause status: Secondary | ICD-10-CM | POA: Diagnosis not present

## 2022-11-10 DIAGNOSIS — Y939 Activity, unspecified: Secondary | ICD-10-CM | POA: Diagnosis not present

## 2023-02-28 DIAGNOSIS — R404 Transient alteration of awareness: Secondary | ICD-10-CM | POA: Diagnosis not present

## 2023-02-28 DIAGNOSIS — I469 Cardiac arrest, cause unspecified: Secondary | ICD-10-CM | POA: Diagnosis not present

## 2023-03-21 DEATH — deceased
